# Patient Record
Sex: Male | Born: 1955 | Race: White | Hispanic: No | Marital: Married | Smoking: Former smoker
Health system: Southern US, Community
[De-identification: ages and names within clinical notes are randomized; demographics above are authoritative.]

## PROBLEM LIST (undated history)

## (undated) DIAGNOSIS — Z96659 Presence of unspecified artificial knee joint: Secondary | ICD-10-CM

## (undated) DIAGNOSIS — M1990 Unspecified osteoarthritis, unspecified site: Secondary | ICD-10-CM

## (undated) DIAGNOSIS — M545 Low back pain: Secondary | ICD-10-CM

## (undated) DIAGNOSIS — R55 Syncope and collapse: Secondary | ICD-10-CM

## (undated) DIAGNOSIS — E785 Hyperlipidemia, unspecified: Secondary | ICD-10-CM

## (undated) DIAGNOSIS — K649 Unspecified hemorrhoids: Secondary | ICD-10-CM

## (undated) HISTORY — PX: OTHER SURGICAL HISTORY: SHX169

## (undated) HISTORY — PX: CARPAL TUNNEL RELEASE: SHX101

## (undated) HISTORY — PX: REFRACTIVE SURGERY: SHX103

---

## 1997-02-22 HISTORY — PX: KNEE SURGERY: SHX244

## 2008-07-26 ENCOUNTER — Encounter: Admission: RE | Admit: 2008-07-26 | Discharge: 2008-07-26 | Payer: Self-pay | Admitting: Orthopedic Surgery

## 2009-09-13 HISTORY — PX: COLONOSCOPY: SHX174

## 2013-02-22 DIAGNOSIS — M545 Low back pain, unspecified: Secondary | ICD-10-CM

## 2013-02-22 HISTORY — DX: Low back pain, unspecified: M54.50

## 2016-11-11 DIAGNOSIS — K649 Unspecified hemorrhoids: Secondary | ICD-10-CM

## 2016-11-11 HISTORY — DX: Unspecified hemorrhoids: K64.9

## 2016-12-10 ENCOUNTER — Emergency Department (INDEPENDENT_AMBULATORY_CARE_PROVIDER_SITE_OTHER)
Admission: EM | Admit: 2016-12-10 | Discharge: 2016-12-10 | Disposition: A | Discharge status: Home / Self Care | Payer: 59 | Source: Home / Self Care | Attending: Family Medicine | Admitting: Family Medicine

## 2016-12-10 ENCOUNTER — Encounter: Payer: Self-pay | Admitting: *Deleted

## 2016-12-10 DIAGNOSIS — K645 Perianal venous thrombosis: Secondary | ICD-10-CM

## 2016-12-10 MED ORDER — HYDROCORTISONE ACETATE 25 MG RE SUPP: 25.0000 mg | Freq: Two times a day (BID) | RECTAL | 0 refills | Status: DC

## 2016-12-10 NOTE — Discharge Instructions (Signed)
Keep gauze bandage in place until bleeding stops.  When bleeding stops, may take warm sitz baths for 20 minutes, 3-4 times a day to ease pain and discomfort until healed.  Do not begin using suppositories until healed.  Eat foods that have a lot of fiber in them, such as whole grains, beans, nuts, fruits, and vegetables. May take a stool softener such as Colace, and a fiber product such as Citrucel to avoid constipation.  If symptoms become significantly worse during the night or over the weekend, proceed to the local emergency room.

## 2016-12-10 NOTE — ED Provider Notes (Signed)
[facility]    CSN: [id number] Arrival date & time: 12/10/16  0808     History   Chief Complaint Chief Complaint  Patient presents with  . Cyst    HPI Franklin Lucas is a 61 y.o. male.   Patient complains of a painful lump near his anus for 3 days.  He has applied preparation H cream and suppository without relief.   The history is provided by the patient.    History reviewed. No pertinent past medical history.  There are no active problems to display for this patient.   Past Surgical History:  Procedure Laterality Date  . CARPAL TUNNEL RELEASE Left   . KNEE SURGERY Right   . LT arm surgery- due to lacerations         Home Medications    Prior to Admission medications   Medication Sig Start Date End Date Taking? Authorizing Provider  hydrocortisone (ANUSOL-HC) 25 MG suppository Place 1 suppository (25 mg total) rectally 2 (two) times daily. 12/10/16   Lattie Haw, MD    Family History Family History  Problem Relation Age of Onset  . Cancer Mother     colon    Social History Social History  Substance Use Topics  . Smoking status: Former Games developer  . Smokeless tobacco: Never Used  . Alcohol use No     Allergies   Patient has no known allergies.   Review of Systems Review of Systems  Constitutional: Negative.   HENT: Negative.   Eyes: Negative.   Respiratory: Negative.   Cardiovascular: Negative.   Gastrointestinal: Positive for rectal pain. Negative for abdominal pain, anal bleeding, blood in stool and constipation.  Genitourinary: Negative.   Musculoskeletal: Negative.   Skin: Negative.      Physical Exam Triage Vital Signs ED Triage Vitals [12/10/16 0828]  Enc Vitals Group     BP 121/77     Pulse Rate 69     Resp 18     Temp 97.8 F (36.6 C)     Temp Source Oral     SpO2 97 %     Weight 279 lb (126.6 kg)     Height  (1.956 m)     Head Circumference      Peak Flow      Pain Score 2     Pain Loc      Pain  Edu?      Excl. in GC?    No data found.   Updated Vital Signs BP 121/77 (BP Location: Left Arm)   Pulse 69   Temp 97.8 F (36.6 C) (Oral)   Resp 18   Ht  (1.956 m)   Wt 279 lb (126.6 kg)   SpO2 97%   BMI 33.08 kg/m   Visual Acuity Right Eye Distance:   Left Eye Distance:   Bilateral Distance:    Right Eye Near:   Left Eye Near:    Bilateral Near:     Physical Exam  Constitutional: He appears well-developed and well-nourished. No distress.  HENT:  Head: Normocephalic.  Eyes: Pupils are equal, round, and reactive to light.  Cardiovascular: Normal heart sounds.   Pulmonary/Chest: Breath sounds normal.  Abdominal: There is no tenderness.  Genitourinary:     Genitourinary Comments: 1.5cm diameter thrombosed hemorrhoid as noted on diagram.   Neurological: He is alert.  Skin: Skin is warm and dry.  Nursing note and vitals reviewed.    UC Treatments / Results  Labs (  all labs ordered are listed, but only abnormal results are displayed) Labs Reviewed - No data to display  EKG  EKG Interpretation None       Radiology No results found.  Procedures Procedures Procedure:  I and D thrombosed hemorrhoid After explaining risks/benefits of procedure and obtaining consent from patient, cleaned anus and surrounding area with Betadine.  Using sterile technique and local anesthesia with 1% lidocaine with epinephine, made a single radial incision in the thrombosed hemorrhoid, extracting several fragments of clotted blood.  Applied sterile absorbent dressing.  Wound precautions given.     Medications Ordered in UC Medications - No data to display   Initial Impression / Assessment and Plan / UC Course  I have reviewed the triage vital signs and the nursing notes.  Pertinent labs & imaging results that were available during my care of the patient were reviewed by me and considered in my medical decision making (see chart for details).    Given Rx for Anusol HC  suppositories. Keep gauze bandage in place until bleeding stops.  When bleeding stops, may take warm sitz baths for 20 minutes, 3-4 times a day to ease pain and discomfort until healed.  Do not begin using suppositories until healed.  Eat foods that have a lot of fiber in them, such as whole grains, beans, nuts, fruits, and vegetables. May take a stool softener such as Colace, and a fiber product such as Citrucel to avoid constipation.  If symptoms become significantly worse during the night or over the weekend, proceed to the local emergency room.    Final Clinical Impressions(s) / UC Diagnoses   Final diagnoses:  Hemorrhoid thrombosis    New Prescriptions New Prescriptions   HYDROCORTISONE (ANUSOL-HC) 25 MG SUPPOSITORY    Place 1 suppository (25 mg total) rectally 2 (two) times daily.     Lattie Haw, MD 12/20/16 1420

## 2016-12-10 NOTE — ED Triage Notes (Signed)
Pt c/o lump near his rectum x 3 days. He has applied preparation H cream and suppository without relief.

## 2018-06-13 ENCOUNTER — Encounter: Payer: Self-pay | Admitting: Student

## 2018-06-13 NOTE — H&P (Signed)
TOTAL KNEE ADMISSION H&P  Patient is being admitted for bilateral total knee arthroplasty.  Subjective:  Chief Complaint:bilateral knee pain.  HPI: Franklin Lucas, 62 y.o. male, has a history of pain and functional disability in the bilateral knee due to arthritis and has failed non-surgical conservative treatments for greater than 12 weeks to includecorticosteriod injections, viscosupplementation injections and activity modification.  Onset of symptoms was gradual, starting >10 years ago with gradually worsening course since that time. The patient noted prior procedures on the knee to include  arthroscopy on the right knee(s).  Patient currently rates pain in the bilateral knee(s) at 5 out of 10 with activity. Patient has worsening of pain with activity and weight bearing, crepitus, joint swelling and instability.  Patient has evidence of bone-on-bone arthritis in the medial and patellofemoral compartments of the left knee and significant tri-compartmental narrowing in the right knee by imaging studies. There is no active infection.  There are no active problems to display for this patient.  No past medical history on file.  Past Surgical History:  Procedure Laterality Date  . CARPAL TUNNEL RELEASE Left   . KNEE SURGERY Right   . LT arm surgery- due to lacerations      No current facility-administered medications for this encounter.    Current Outpatient Medications  Medication Sig Dispense Refill Last Dose  . hydrocortisone (ANUSOL-HC) 25 MG suppository Place 1 suppository (25 mg total) rectally 2 (two) times daily. 12 suppository 0    No Known Allergies  Social History   Tobacco Use  . Smoking status: Former Games developer  . Smokeless tobacco: Never Used  Substance Use Topics  . Alcohol use: No    Family History  Problem Relation Age of Onset  . Cancer Mother        colon     Review of Systems  Constitutional: Negative for chills and fever.  HENT: Negative for congestion, sore  throat and tinnitus.   Eyes: Negative for double vision, photophobia and pain.  Respiratory: Negative for cough, shortness of breath and wheezing.   Cardiovascular: Negative for chest pain, palpitations and orthopnea.  Gastrointestinal: Negative for heartburn, nausea and vomiting.  Genitourinary: Negative for dysuria, frequency and urgency.  Musculoskeletal: Positive for joint pain.  Neurological: Negative for dizziness, weakness and headaches.    Objective:  Physical Exam  Well nourished and well developed. General: Alert and oriented x3, cooperative and pleasant, no acute distress. Head: normocephalic, atraumatic, neck supple. Eyes: EOMI. Respiratory: breath sounds clear in all fields, no wheezing, rales, or rhonchi. Cardiovascular: Regular rate and rhythm, no murmurs, gallops or rubs.  Abdomen: non-tender to palpation and soft, normoactive bowel sounds. Musculoskeletal: Antalgic gait without using assisted devices.  Right Hip Exam: ROM: Normal without discomfort. There is no tenderness over the greater trochanter bursa. There is no pain on provocative testing of the hip. Left Hip Exam: ROM: Normal without discomfort. There is no tenderness over the greater trochanter bursa. There is no pain on provocative testing of the hip. Right Knee Exam: No effusion. Range of motion is 0-125 degrees. Moderate crepitus on range of motion of the knee. Positive medial joint line tenderness. No lateral joint line tenderness. Stable knee. Left Knee Exam: No effusion. Range of motion is 5-125 degrees. Moderate crepitus on range of motion of the knee. Positive medial joint line tenderness. No lateral joint line tenderness. Small amount of varus-valgus play. Calves soft and nontender. Motor function intact in LE. Strength 5/5 LE bilaterally. Neuro: Distal pulses 2+.  Sensation to light touch intact in LE.  Vital signs in last 24 hours: Blood pressure: 124/88 mmHg Pulse: 60 bpm   Labs:   Estimated  body mass index is 33.08 kg/m as calculated from the following:   Height as of 12/10/16: 6\' 5"  (1.956 m).   Weight as of 12/10/16: 126.6 kg.   Imaging Review Plain radiographs demonstrate severe degenerative joint disease of the bilateral knee(s). The overall alignment isneutral. The bone quality appears to be adequate for age and reported activity level.   Preoperative templating of the joint replacement has been completed, documented, and submitted to the Operating Room personnel in order to optimize intra-operative equipment management.   Anticipated LOS equal to or greater than 2 midnights due to - Age 61 and older with one or more of the following:  - Obesity  - Expected need for hospital services (PT, OT, Nursing) required for safe  discharge  - Anticipated need for postoperative skilled nursing care or inpatient rehab  - Active co-morbidities: None OR   - Unanticipated findings during/Post Surgery: None  - Patient is a high risk of re-admission due to: None     Assessment/Plan:  End stage arthritis, bilateral knee   The patient history, physical examination, clinical judgment of the provider and imaging studies are consistent with end stage degenerative joint disease of the bilateral knee(s) and total knee arthroplasty is deemed medically necessary. The treatment options including medical management, injection therapy arthroscopy and arthroplasty were discussed at length. The risks and benefits of total knee arthroplasty were presented and reviewed. The risks due to aseptic loosening, infection, stiffness, patella tracking problems, thromboembolic complications and other imponderables were discussed. The patient acknowledged the explanation, agreed to proceed with the plan and consent was signed. Patient is being admitted for inpatient treatment for surgery, pain control, PT, OT, prophylactic antibiotics, VTE prophylaxis, progressive ambulation and ADL's and discharge planning.  The patient is planning to be discharged to inpatient rehab.  Therapy Plans: [facility] Disposition: inpatient rehab Planned DVT Prophylaxis: Xarelto 10 mg daily DME needed: Dan Humphreys, 3-n-1 PCP: Dr. Alver Fisher TXA: IV Allergies: NKDA Anesthesia Concerns: None  - Patient was instructed on what medications to stop prior to surgery. - Follow-up visit in 2 weeks with Dr. Lequita Halt - Begin physical therapy following surgery - Pre-operative lab work as pre-surgical testing - Prescriptions will be provided in hospital at time of discharge  Arther Abbott, PA-C Orthopedic Surgery [facility] [place]

## 2018-06-16 ENCOUNTER — Encounter (HOSPITAL_COMMUNITY): Payer: Self-pay

## 2018-06-16 ENCOUNTER — Other Ambulatory Visit: Payer: Self-pay

## 2018-06-16 ENCOUNTER — Emergency Department (HOSPITAL_COMMUNITY)
Admission: EM | Admit: 2018-06-16 | Discharge: 2018-06-16 | Disposition: A | Discharge status: Home / Self Care | Payer: 59 | Attending: Emergency Medicine | Admitting: Emergency Medicine

## 2018-06-16 ENCOUNTER — Emergency Department (HOSPITAL_COMMUNITY): Payer: 59

## 2018-06-16 DIAGNOSIS — Z87891 Personal history of nicotine dependence: Secondary | ICD-10-CM | POA: Diagnosis not present

## 2018-06-16 DIAGNOSIS — R55 Syncope and collapse: Secondary | ICD-10-CM

## 2018-06-16 HISTORY — DX: Syncope and collapse: R55

## 2018-06-16 LAB — CBC WITH DIFFERENTIAL/PLATELET
Basophils Absolute: 0 10*3/uL (ref 0.0–0.1)
Basophils Relative: 0 %
EOS PCT: 1 %
Eosinophils Absolute: 0.1 10*3/uL (ref 0.0–0.7)
HCT: 38.2 % — ABNORMAL LOW (ref 39.0–52.0)
Hemoglobin: 12.7 g/dL — ABNORMAL LOW (ref 13.0–17.0)
LYMPHS ABS: 1.6 10*3/uL (ref 0.7–4.0)
LYMPHS PCT: 16 %
MCH: 30.9 pg (ref 26.0–34.0)
MCHC: 33.2 g/dL (ref 30.0–36.0)
MCV: 92.9 fL (ref 78.0–100.0)
MONO ABS: 0.9 10*3/uL (ref 0.1–1.0)
MONOS PCT: 9 %
Neutro Abs: 7.6 10*3/uL (ref 1.7–7.7)
Neutrophils Relative %: 74 %
PLATELETS: 195 10*3/uL (ref 150–400)
RBC: 4.11 MIL/uL — ABNORMAL LOW (ref 4.22–5.81)
RDW: 12.3 % (ref 11.5–15.5)
WBC: 10.2 10*3/uL (ref 4.0–10.5)

## 2018-06-16 LAB — MAGNESIUM: Magnesium: 2 mg/dL (ref 1.7–2.4)

## 2018-06-16 LAB — PROTIME-INR
INR: 1.02
PROTHROMBIN TIME: 13.3 s (ref 11.4–15.2)

## 2018-06-16 LAB — COMPREHENSIVE METABOLIC PANEL
ALBUMIN: 3.8 g/dL (ref 3.5–5.0)
ALK PHOS: 55 U/L (ref 38–126)
ALT: 20 U/L (ref 0–44)
ANION GAP: 5 (ref 5–15)
AST: 24 U/L (ref 15–41)
BILIRUBIN TOTAL: 0.6 mg/dL (ref 0.3–1.2)
BUN: 15 mg/dL (ref 6–20)
CALCIUM: 8.5 mg/dL — AB (ref 8.9–10.3)
CO2: 24 mmol/L (ref 22–32)
Chloride: 108 mmol/L (ref 98–111)
Creatinine, Ser: 0.98 mg/dL (ref 0.61–1.24)
GFR calc Af Amer: >60 mL/min (ref >60)
GFR calc non Af Amer: >60 mL/min (ref >60)
GLUCOSE: 112 mg/dL — AB (ref 70–99)
POTASSIUM: 4.1 mmol/L (ref 3.5–5.1)
Sodium: 137 mmol/L (ref 135–145)
TOTAL PROTEIN: 6.5 g/dL (ref 6.5–8.1)

## 2018-06-16 LAB — D-DIMER, QUANTITATIVE (NOT AT ARMC): D DIMER QUANT: 0.5 ug{FEU}/mL (ref 0.00–0.50)

## 2018-06-16 MED ORDER — LACTATED RINGERS IV BOLUS
2000.0000 mL | Freq: Once | INTRAVENOUS | Status: AC
  Administered 2018-06-16: 2000 mL via INTRAVENOUS

## 2018-06-16 NOTE — ED Provider Notes (Signed)
Emergency Department Provider Note   I have reviewed the triage vital signs and the nursing notes.   HISTORY  Chief Complaint Loss of Consciousness   HPI Franklin Lucas is a 62 y.o. male who presents the [facility] today for syncope.  Patient states that he has been overall well health without any known medical problems until today.  He had a shingles shot shortly after they started feeling really sleepy.  Could not explain it but just that he felt sleepy.  Did not get much better and was driving up here from [place] for a soccer game and was sitting in a chair under a tent when he was holding his wife's hand and his hand also got sweaty and she looked over and the patient lost consciousness.  He was unresponsive for approximately 2 minutes.  Apparently someone there has stethoscopes that he had a low pulse.  EMS was called and their arrival patient was slightly hypotensive with blood pressures in the 90s over 60s.  After he woke up he knew who he was and where he was and what was going on.  Did not hit his head or have any trauma during the syncopal episode.  Denies any associated vision changes, weakness, numbness, chest pain, headache or other associated symptoms.  This happened one time before but only when he was watching the birth of his daughter.  No known family history of early death or cardiac arrhythmias.  Patient has no medical history. No other associated or modifying symptoms.    History reviewed. No pertinent past medical history.  There are no active problems to display for this patient.   Past Surgical History:  Procedure Laterality Date  . CARPAL TUNNEL RELEASE Left   . KNEE SURGERY Right   . LT arm surgery- due to lacerations      Current Outpatient Rx  . Order #: [id number] Class: Normal    Allergies Patient has no known allergies.  Family History  Problem Relation Age of Onset  . Cancer Mother        colon    Social History Social History    Tobacco Use  . Smoking status: Former Games developer  . Smokeless tobacco: Never Used  Substance Use Topics  . Alcohol use: No  . Drug use: No    Review of Systems  All other systems negative except as documented in the HPI. All pertinent positives and negatives as reviewed in the HPI. ____________________________________________   PHYSICAL EXAM:  VITAL SIGNS: ED Triage Vitals [06/16/18 1710]  Enc Vitals Group     BP      Pulse      Resp      Temp      Temp src      SpO2      Weight 275 lb (124.7 kg)     Height 6\' 5"  (1.956 m)     Head Circumference      Peak Flow      Pain Score      Pain Loc      Pain Edu?      Excl. in GC?     Constitutional: Alert and oriented. Well appearing and in no acute distress. Eyes: Conjunctivae are normal. PERRL. EOMI. Head: Atraumatic. Nose: No congestion/rhinnorhea. Mouth/Throat: Mucous membranes are moist.  Oropharynx non-erythematous. Neck: No stridor.  No meningeal signs.   Cardiovascular: Normal rate, regular rhythm. Good peripheral circulation. Grossly normal heart sounds.   Respiratory: Normal respiratory effort.  No retractions. Lungs  CTAB. Oxygen saturations around 94-97% when talking.  Gastrointestinal: Soft and nontender. No distention.  Musculoskeletal: No lower extremity tenderness nor edema. No gross deformities of extremities. Neurologic:  Normal speech and language. No gross focal neurologic deficits are appreciated.  Skin:  Skin is warm, dry and intact. No rash noted.   ____________________________________________   LABS (all labs ordered are listed, but only abnormal results are displayed)  Labs Reviewed  CBC WITH DIFFERENTIAL/PLATELET - Abnormal; Notable for the following components:      Result Value   RBC 4.11 (*)    Hemoglobin 12.7 (*)    HCT 38.2 (*)    All other components within normal limits  COMPREHENSIVE METABOLIC PANEL - Abnormal; Notable for the following components:   Glucose, Bld 112 (*)     Calcium 8.5 (*)    All other components within normal limits  MAGNESIUM  D-DIMER, QUANTITATIVE (NOT AT [facility])  PROTIME-INR  CBG MONITORING, ED   ____________________________________________  EKG   EKG Interpretation  Date/Time:  Friday June 16 2018 17:19:58 EDT Ventricular Rate:  68 PR Interval:    QRS Duration: 106 QT Interval:  420 QTC Calculation: 447 R Axis:   52 Text Interpretation:  Sinus rhythm Abnormal inferior Q waves No old tracing to compare Confirmed by Marily Memos [phone]) on 06/16/2018 7:51:30 PM       ____________________________________________  RADIOLOGY  Dg Chest 2 View  Result Date: 06/16/2018 CLINICAL DATA:  Syncope today. EXAM: CHEST - 2 VIEW COMPARISON:  None. FINDINGS: The lungs are clear. Heart size is normal. No pneumothorax or pleural fluid. No acute or focal bony abnormality. IMPRESSION: No acute disease. Electronically Signed   By: Drusilla Kanner M.D.   On: 06/16/2018 18:46    ____________________________________________   INITIAL IMPRESSION / ASSESSMENT AND PLAN / ED COURSE  Very odd story for syncope.  No vasovagal type symptoms.  Could be cardiac arrhythmia.  Also could be PE as his oxygen saturations are low but low and he has had any recent illnesses nor does have a history of smoking.  Could be cardiac related.  Low suspicion for neurologic causes such as stroke as he has no neuro deficits at this time and no history of the same.  Unremarkable.  D-dimer appropriate for his age.  Blood pressure 135 systolic after couple liters of fluid patient asymptomatic.  We will continue following up with his primary doctor.  Low suspicion for any emergent causes for his syncope at this time however still a bit atypical.  Pertinent labs & imaging results that were available during my care of the patient were reviewed by me and considered in my medical decision making (see chart for details).  ____________________________________________  FINAL  CLINICAL IMPRESSION(S) / ED DIAGNOSES  Final diagnoses:  Syncope, unspecified syncope type    MEDICATIONS GIVEN DURING THIS VISIT:  Medications  lactated ringers bolus 2,000 mL (0 mLs Intravenous Stopped 06/16/18 1850)    NEW OUTPATIENT MEDICATIONS STARTED DURING THIS VISIT:  Discharge Medication List as of 06/16/2018  7:52 PM      Note:  This note was prepared with assistance of Dragon voice recognition software. Occasional wrong-word or sound-a-like substitutions may have occurred due to the inherent limitations of voice recognition software.   Marily Memos, MD 06/16/18 2053

## 2018-06-16 NOTE — ED Notes (Signed)
Pt awake, alert and oriented x 3 at time of discharge. Pt ambulatory with observed steady gait. Pt 's family member upset of delay in discharge. Explained to family member was waiting on discharge paper work. Dr Erin Hearing in to bedside to explain findings and plan of care. Pt discharged to home with family. No new complaints or concerns verbalized.

## 2018-06-16 NOTE — ED Triage Notes (Signed)
Pt was watching his grandson play soccer this afternoon and pt had a syncopal episode.  Pt says he had a shingles vaccine this morning.  Pt says prior to the soccer game he noticed he felt tired.  EMS says initial bp was 96/60.  EMS started IV and gave bolus nss.  Pt 100/59 after bolus.  Pt alert and oriented.  Denies any pain.  Denies fall.

## 2018-07-04 ENCOUNTER — Encounter (HOSPITAL_COMMUNITY): Payer: Self-pay

## 2018-07-04 NOTE — Pre-Procedure Instructions (Signed)
Th following are on the chart: Medical clearance Dr. Alver Fisher 05/10/2018 Last office visit note 02/17/2018  The following are in epic: EKG 06/16/2018 CBC/diff, CMP. PT 06/16/2018

## 2018-07-04 NOTE — Patient Instructions (Signed)
Your procedure is scheduled on: Wednesday, Oct. 30, 2019   Surgery Time:  10:30AM-1:30PM   Report to [facility] Main  Entrance    Report to admitting at 8:00 AM   Call this number if you have problems the morning of surgery [phone]   Do not eat food or drink liquids :After Midnight.   Brush your teeth the morning of surgery.   Do NOT smoke after Midnight   Take these medicines the morning of surgery with A SIP OF WATER: None   May use eye drops per normal routine                               You may not have any metal on your body including jewelry, and body piercings             Do not wear lotions, powders, perfumes/cologne, or deodorant                          Men may shave face and neck.   Do not bring valuables to the hospital. [facility] IS NOT             RESPONSIBLE   FOR VALUABLES.   Contacts, dentures or bridgework may not be worn into surgery.   Leave suitcase in the car. After surgery it may be brought to your room.   Special Instructions: Bring a copy of your healthcare power of attorney and living will documents         the day of surgery if you haven't scanned them in before.              Please read over the following fact sheets you were given:  [facility] - Preparing for Surgery Before surgery, you can play an important role.  Because skin is not sterile, your skin needs to be as free of germs as possible.  You can reduce the number of germs on your skin by washing with CHG (chlorahexidine gluconate) soap before surgery.  CHG is an antiseptic cleaner which kills germs and bonds with the skin to continue killing germs even after washing. Please DO NOT use if you have an allergy to CHG or antibacterial soaps.  If your skin becomes reddened/irritated stop using the CHG and inform your nurse when you arrive at Short Stay. Do not shave (including legs and underarms) for at least 48 hours prior to the first CHG shower.  You may shave your  face/neck.  Please follow these instructions carefully:  1.  Shower with CHG Soap the night before surgery and the  morning of surgery.  2.  If you choose to wash your hair, wash your hair first as usual with your normal  shampoo.  3.  After you shampoo, rinse your hair and body thoroughly to remove the shampoo.                             4.  Use CHG as you would any other liquid soap.  You can apply chg directly to the skin and wash.  Gently with a scrungie or clean washcloth.  5.  Apply the CHG Soap to your body ONLY FROM THE NECK DOWN.   Do   not use on face/ open  Wound or open sores. Avoid contact with eyes, ears mouth and   genitals (private parts).                       Wash face,  Genitals (private parts) with your normal soap.             6.  Wash thoroughly, paying special attention to the area where your    surgery  will be performed.  7.  Thoroughly rinse your body with warm water from the neck down.  8.  DO NOT shower/wash with your normal soap after using and rinsing off the CHG Soap.                9.  Pat yourself dry with a clean towel.            10.  Wear clean pajamas.            11.  Place clean sheets on your bed the night of your first shower and do not  sleep with pets. Day of Surgery : Do not apply any lotions/deodorants the morning of surgery.  Please wear clean clothes to the hospital/surgery center.  FAILURE TO FOLLOW THESE INSTRUCTIONS MAY RESULT IN THE CANCELLATION OF YOUR SURGERY  PATIENT SIGNATURE_________________________________  NURSE SIGNATURE__________________________________  ________________________________________________________________________   Franklin Lucas  An incentive spirometer is a tool that can help keep your lungs clear and active. This tool measures how well you are filling your lungs with each breath. Taking long deep breaths may help reverse or decrease the chance of developing breathing (pulmonary)  problems (especially infection) following:  A long period of time when you are unable to move or be active. BEFORE THE PROCEDURE   If the spirometer includes an indicator to show your best effort, your nurse or respiratory therapist will set it to a desired goal.  If possible, sit up straight or lean slightly forward. Try not to slouch.  Hold the incentive spirometer in an upright position. INSTRUCTIONS FOR USE  1. Sit on the edge of your bed if possible, or sit up as far as you can in bed or on a chair. 2. Hold the incentive spirometer in an upright position. 3. Breathe out normally. 4. Place the mouthpiece in your mouth and seal your lips tightly around it. 5. Breathe in slowly and as deeply as possible, raising the piston or the ball toward the top of the column. 6. Hold your breath for 3-5 seconds or for as long as possible. Allow the piston or ball to fall to the bottom of the column. 7. Remove the mouthpiece from your mouth and breathe out normally. 8. Rest for a few seconds and repeat Steps 1 through 7 at least 10 times every 1-2 hours when you are awake. Take your time and take a few normal breaths between deep breaths. 9. The spirometer may include an indicator to show your best effort. Use the indicator as a goal to work toward during each repetition. 10. After each set of 10 deep breaths, practice coughing to be sure your lungs are clear. If you have an incision (the cut made at the time of surgery), support your incision when coughing by placing a pillow or rolled up towels firmly against it. Once you are able to get out of bed, walk around indoors and cough well. You may stop using the incentive spirometer when instructed by your caregiver.  RISKS AND COMPLICATIONS  Take your time  so you do not get dizzy or light-headed.  If you are in pain, you may need to take or ask for pain medication before doing incentive spirometry. It is harder to take a deep breath if you are having  pain. AFTER USE  Rest and breathe slowly and easily.  It can be helpful to keep track of a log of your progress. Your caregiver can provide you with a simple table to help with this. If you are using the spirometer at home, follow these instructions: [place] IF:   You are having difficultly using the spirometer.  You have trouble using the spirometer as often as instructed.  Your pain medication is not giving enough relief while using the spirometer.  You develop fever of 100.5 F (38.1 C) or higher. SEEK IMMEDIATE MEDICAL CARE IF:   You cough up bloody sputum that had not been present before.  You develop fever of 102 F (38.9 C) or greater.  You develop worsening pain at or near the incision site. MAKE SURE YOU:   Understand these instructions.  Will watch your condition.  Will get help right away if you are not doing well or get worse. Document Released: 01/10/2007 Document Revised: 11/22/2011 Document Reviewed: 03/13/2007 Exit[employer] Patient Information 2014 Exit[employer], [place].   ________________________________________________________________________  WHAT IS A BLOOD TRANSFUSION? Blood Transfusion Information  A transfusion is the replacement of blood or some of its parts. Blood is made up of multiple cells which provide different functions.  Red blood cells carry oxygen and are used for blood loss replacement.  White blood cells fight against infection.  Platelets control bleeding.  Plasma helps clot blood.  Other blood products are available for specialized needs, such as hemophilia or other clotting disorders. BEFORE THE TRANSFUSION  Who gives blood for transfusions?   Healthy volunteers who are fully evaluated to make sure their blood is safe. This is blood bank blood. Transfusion therapy is the safest it has ever been in the practice of medicine. Before blood is taken from a donor, a complete history is taken to make sure that person has no history  of diseases nor engages in risky social behavior (examples are intravenous drug use or sexual activity with multiple partners). The donor's travel history is screened to minimize risk of transmitting infections, such as malaria. The donated blood is tested for signs of infectious diseases, such as HIV and hepatitis. The blood is then tested to be sure it is compatible with you in order to minimize the chance of a transfusion reaction. If you or a relative donates blood, this is often done in anticipation of surgery and is not appropriate for emergency situations. It takes many days to process the donated blood. RISKS AND COMPLICATIONS Although transfusion therapy is very safe and saves many lives, the main dangers of transfusion include:   Getting an infectious disease.  Developing a transfusion reaction. This is an allergic reaction to something in the blood you were given. Every precaution is taken to prevent this. The decision to have a blood transfusion has been considered carefully by your caregiver before blood is given. Blood is not given unless the benefits outweigh the risks. AFTER THE TRANSFUSION  Right after receiving a blood transfusion, you will usually feel much better and more energetic. This is especially true if your red blood cells have gotten low (anemic). The transfusion raises the level of the red blood cells which carry oxygen, and this usually causes an energy increase.  The  nurse administering the transfusion will monitor you carefully for complications. HOME CARE INSTRUCTIONS  No special instructions are needed after a transfusion. You may find your energy is better. Speak with your caregiver about any limitations on activity for underlying diseases you may have. SEEK MEDICAL CARE IF:   Your condition is not improving after your transfusion.  You develop redness or irritation at the intravenous (IV) site. SEEK IMMEDIATE MEDICAL CARE IF:  Any of the following symptoms  occur over the next 12 hours:  Shaking chills.  You have a temperature by mouth above 102 F (38.9 C), not controlled by medicine.  Chest, back, or muscle pain.  People around you feel you are not acting correctly or are confused.  Shortness of breath or difficulty breathing.  Dizziness and fainting.  You get a rash or develop hives.  You have a decrease in urine output.  Your urine turns a dark color or changes to pink, red, or brown. Any of the following symptoms occur over the next 10 days:  You have a temperature by mouth above 102 F (38.9 C), not controlled by medicine.  Shortness of breath.  Weakness after normal activity.  The white part of the eye turns yellow (jaundice).  You have a decrease in the amount of urine or are urinating less often.  Your urine turns a dark color or changes to pink, red, or brown. Document Released: 08/27/2000 Document Revised: 11/22/2011 Document Reviewed: 04/15/2008 [facility] Patient Information 2014 [place], [place].  _______________________________________________________________________

## 2018-07-06 ENCOUNTER — Other Ambulatory Visit: Payer: Self-pay

## 2018-07-06 ENCOUNTER — Encounter (HOSPITAL_COMMUNITY)
Admission: RE | Admit: 2018-07-06 | Discharge: 2018-07-06 | Disposition: A | Discharge status: Home / Self Care | Payer: 59 | Source: Ambulatory Visit | Attending: Orthopedic Surgery | Admitting: Orthopedic Surgery

## 2018-07-06 ENCOUNTER — Encounter (HOSPITAL_COMMUNITY): Payer: Self-pay

## 2018-07-06 DIAGNOSIS — Z01812 Encounter for preprocedural laboratory examination: Secondary | ICD-10-CM | POA: Insufficient documentation

## 2018-07-06 DIAGNOSIS — M170 Bilateral primary osteoarthritis of knee: Secondary | ICD-10-CM | POA: Diagnosis not present

## 2018-07-06 HISTORY — DX: Hyperlipidemia, unspecified: E78.5

## 2018-07-06 HISTORY — DX: Unspecified osteoarthritis, unspecified site: M19.90

## 2018-07-06 HISTORY — DX: Unspecified hemorrhoids: K64.9

## 2018-07-06 HISTORY — DX: Syncope and collapse: R55

## 2018-07-06 HISTORY — DX: Low back pain: M54.5

## 2018-07-06 LAB — ABO/RH: ABO/RH(D): O POS

## 2018-07-06 LAB — SURGICAL PCR SCREEN
MRSA, PCR: NEGATIVE
Staphylococcus aureus: NEGATIVE

## 2018-07-06 LAB — APTT: APTT: 28 s (ref 24–36)

## 2018-07-07 ENCOUNTER — Encounter: Payer: Self-pay | Admitting: Internal Medicine

## 2018-07-07 ENCOUNTER — Ambulatory Visit (INDEPENDENT_AMBULATORY_CARE_PROVIDER_SITE_OTHER): Payer: 59 | Admitting: Internal Medicine

## 2018-07-07 VITALS — BP 128/74 | HR 74 | Ht 77.0 in | Wt 279.2 lb

## 2018-07-07 DIAGNOSIS — Z01810 Encounter for preprocedural cardiovascular examination: Secondary | ICD-10-CM

## 2018-07-07 NOTE — Patient Instructions (Addendum)
Medication Instructions:  Your physician recommends that you continue on your current medications as directed. Please refer to the Current Medication list given to you today.  If you need a refill on your cardiac medications before your next appointment, please call your pharmacy.   Lab work: None ordered  Testing/Procedures: Your physician has requested that you have a stress echocardiogram. For further information please visit [email or web address] Please follow instruction sheet as given.  Your test is scheduled on 07/11/18 @2pm  please arrive at 1:45pm [facility] [address] Phone [phone]  Follow-Up: At [facility], you and your health needs are our priority.  As part of our continuing mission to provide you with exceptional heart care, we have created designated Provider Care Teams.  These Care Teams include your primary Cardiologist (physician) and Advanced Practice Providers (APPs -  Physician Assistants and Nurse Practitioners) who all work together to provide you with the care you need, when you need it. You will need a follow up appointment as needed pending results.     Any Other Special Instructions Will Be Listed Below (If Applicable).   Exercise Stress Echocardiogram An exercise stress echocardiogram is a test to check how well your heart is working. This test uses sound waves (ultrasound) and a computer to make images of your heart before and after exercise. Ultrasound images that are taken before you exercise (your resting echocardiogram) will show how much blood is getting to your heart muscle and how well your heart muscle and heart valves are functioning. During the next part of this test, you will walk on a treadmill or ride a stationary bike to see how exercise affects your heart. While you exercise, the electrical activity of your heart will be monitored with an electrocardiogram (ECG). Your blood pressure will also be  monitored. You may have this test if you:  Have chest pain or other symptoms of a heart problem.  Recently had a heart attack or heart surgery.  Have heart valve problems.  Have a condition that causes narrowing of the blood vessels that supply your heart (coronary artery disease).  Have a high risk of heart disease and are starting a new exercise program.  Have a high risk of heart disease and need to have major surgery.  Tell a health care provider about:  Any allergies you have.  All medicines you are taking, including vitamins, herbs, eye drops, creams, and over-the-counter medicines.  Any problems you or family members have had with anesthetic medicines.  Any blood disorders you have.  Any surgeries you have had.  Any medical conditions you have.  Whether you are pregnant or may be pregnant. What are the risks? Generally, this is a safe procedure. However, problems may occur, including:  Chest pain.  Dizziness or light-headedness.  Shortness of breath.  Increased or irregular heartbeat (palpitations).  Nausea or vomiting.  Heart attack (very rare).  What happens before the procedure?  Follow instructions from your health care provider about eating or drinking restrictions. You may be asked to avoid all forms of caffeine for 24 hours before your procedure, or as told by your health care provider.  Ask your health care provider about changing or stopping your regular medicines. This is especially important if you are taking diabetes medicines or blood thinners.  If you use an inhaler, bring it with you to the test.  Wear loose, comfortable clothing and walking shoes.  Do notuse any products that contain nicotine or tobacco,  such as cigarettes and e-cigarettes, for 4 hours before the test or as told by your health care provider. If you need help quitting, ask your health care provider. What happens during the procedure?  You will take off your clothes from  the waist up and put on a hospital gown.  A technician will place electrodes on your chest.  A blood pressure cuff will be placed on your arm.  You will lie down on a table for an ultrasound exam before you exercise. Gel will be rubbed on your chest, and a handheld device (transducer) will be pressed against your chest and moved over your heart.  Then, you will start exercising by walking on a treadmill or pedaling a stationary bicycle.  Your blood pressure and heart rhythm will be monitored while you exercise.  The exercise will gradually get harder or faster.  You will exercise until: ? Your heart reaches a target level. ? You are too tired to continue. ? You cannot continue because of chest pain, weakness, or dizziness.  You will have another ultrasound exam after you stop exercising. The procedure may vary among health care providers and hospitals. What happens after the procedure?  Your heart rate and blood pressure will be monitored until they return to your normal levels. Summary  An exercise stress echocardiogram is a test that uses ultrasound to check how well your heart works before and after exercise.  Before the test, follow instructions from your health care provider about stopping medications, avoiding nicotine and tobacco, and avoiding certain foods and drinks.  During the test, your blood pressure and heart rhythm will be monitored while you exercise on a treadmill or stationary bicycle. This information is not intended to replace advice given to you by your health care provider. Make sure you discuss any questions you have with your health care provider. Document Released: 09/03/2004 Document Revised: 04/21/2016 Document Reviewed: 04/21/2016 Elsevier Interactive Patient Education  2018 [employer].

## 2018-07-10 ENCOUNTER — Telehealth (HOSPITAL_COMMUNITY): Payer: Self-pay | Admitting: *Deleted

## 2018-07-10 NOTE — Telephone Encounter (Signed)
Patient given detailed instructions per Stress Test Requisition Sheet for test on 07/11/18 at 200.Patient Notified to arrive 30 minutes early, and that it is imperative to arrive on time for appointment to keep from having the test rescheduled.  Patient verbalized understanding. Franklin Lucas, [name]

## 2018-07-11 ENCOUNTER — Other Ambulatory Visit: Payer: Self-pay

## 2018-07-11 ENCOUNTER — Ambulatory Visit (HOSPITAL_BASED_OUTPATIENT_CLINIC_OR_DEPARTMENT_OTHER): Payer: 59

## 2018-07-11 ENCOUNTER — Ambulatory Visit (HOSPITAL_COMMUNITY): Payer: 59

## 2018-07-11 DIAGNOSIS — Z01810 Encounter for preprocedural cardiovascular examination: Secondary | ICD-10-CM | POA: Diagnosis not present

## 2018-07-11 MED ORDER — DEXTROSE 5 % IV SOLN
3.0000 g | INTRAVENOUS | Status: AC
  Administered 2018-07-12: 3 g via INTRAVENOUS
  Filled 2018-07-11: qty 3

## 2018-07-11 NOTE — Pre-Procedure Instructions (Signed)
Lauren RN in short stay made aware that we are still awaiting cardiac clearance from stress echo results  performed 07/11/2018.

## 2018-07-11 NOTE — Pre-Procedure Instructions (Signed)
Spoke with Tresa Endo at Dr. Deri Fuelling office she stated the cardiology office will put a note in epic concerning cardiac clearance this evening.

## 2018-07-12 ENCOUNTER — Encounter (HOSPITAL_COMMUNITY): Payer: Self-pay

## 2018-07-12 ENCOUNTER — Telehealth: Payer: Self-pay

## 2018-07-12 ENCOUNTER — Other Ambulatory Visit: Payer: Self-pay

## 2018-07-12 ENCOUNTER — Inpatient Hospital Stay (HOSPITAL_COMMUNITY)
Admission: RE | Admit: 2018-07-12 | Discharge: 2018-07-17 | DRG: 462 | Disposition: A | Discharge status: Rehabilitation Facility | Payer: 59 | Attending: Orthopedic Surgery | Admitting: Orthopedic Surgery

## 2018-07-12 ENCOUNTER — Encounter (HOSPITAL_COMMUNITY)
Admission: RE | Disposition: A | Discharge status: Rehabilitation Facility | Payer: Self-pay | Source: Ambulatory Visit | Attending: Orthopedic Surgery

## 2018-07-12 ENCOUNTER — Inpatient Hospital Stay (HOSPITAL_COMMUNITY): Payer: 59 | Admitting: Anesthesiology

## 2018-07-12 DIAGNOSIS — G8918 Other acute postprocedural pain: Secondary | ICD-10-CM

## 2018-07-12 DIAGNOSIS — D62 Acute posthemorrhagic anemia: Secondary | ICD-10-CM

## 2018-07-12 DIAGNOSIS — M1710 Unilateral primary osteoarthritis, unspecified knee: Secondary | ICD-10-CM | POA: Diagnosis present

## 2018-07-12 DIAGNOSIS — M7989 Other specified soft tissue disorders: Secondary | ICD-10-CM | POA: Diagnosis not present

## 2018-07-12 DIAGNOSIS — Z87891 Personal history of nicotine dependence: Secondary | ICD-10-CM | POA: Diagnosis not present

## 2018-07-12 DIAGNOSIS — E785 Hyperlipidemia, unspecified: Secondary | ICD-10-CM | POA: Diagnosis present

## 2018-07-12 DIAGNOSIS — Z96653 Presence of artificial knee joint, bilateral: Secondary | ICD-10-CM | POA: Diagnosis not present

## 2018-07-12 DIAGNOSIS — Z4789 Encounter for other orthopedic aftercare: Secondary | ICD-10-CM | POA: Diagnosis not present

## 2018-07-12 DIAGNOSIS — Z6833 Body mass index (BMI) 33.0-33.9, adult: Secondary | ICD-10-CM

## 2018-07-12 DIAGNOSIS — E669 Obesity, unspecified: Secondary | ICD-10-CM | POA: Diagnosis present

## 2018-07-12 DIAGNOSIS — R609 Edema, unspecified: Secondary | ICD-10-CM | POA: Diagnosis not present

## 2018-07-12 DIAGNOSIS — M179 Osteoarthritis of knee, unspecified: Secondary | ICD-10-CM | POA: Diagnosis present

## 2018-07-12 DIAGNOSIS — Z9889 Other specified postprocedural states: Secondary | ICD-10-CM | POA: Diagnosis not present

## 2018-07-12 DIAGNOSIS — K5903 Drug induced constipation: Secondary | ICD-10-CM

## 2018-07-12 DIAGNOSIS — M170 Bilateral primary osteoarthritis of knee: Secondary | ICD-10-CM | POA: Diagnosis present

## 2018-07-12 HISTORY — PX: TOTAL KNEE ARTHROPLASTY: SHX125

## 2018-07-12 LAB — TYPE AND SCREEN
ABO/RH(D): O POS
Antibody Screen: NEGATIVE

## 2018-07-12 SURGERY — ARTHROPLASTY, KNEE, BILATERAL, TOTAL
Anesthesia: Spinal | Site: Knee | Laterality: Bilateral

## 2018-07-12 MED ORDER — PHENOL 1.4 % MT LIQD: 1.0000 | OROMUCOSAL | Status: DC | PRN

## 2018-07-12 MED ORDER — PHENYLEPHRINE HCL 10 MG/ML IJ SOLN
INTRAMUSCULAR | Status: AC
  Filled 2018-07-12: qty 1

## 2018-07-12 MED ORDER — SODIUM CHLORIDE 0.9 % IV SOLN
INTRAVENOUS | Status: DC
  Administered 2018-07-12 – 2018-07-13 (×4): via INTRAVENOUS

## 2018-07-12 MED ORDER — ROPIVACAINE HCL 2 MG/ML IJ SOLN
10.0000 mL/h | INTRAMUSCULAR | Status: DC
  Administered 2018-07-12 – 2018-07-13 (×2): 10 mL/h via EPIDURAL
  Filled 2018-07-12 (×3): qty 200

## 2018-07-12 MED ORDER — FENTANYL CITRATE (PF) 100 MCG/2ML IJ SOLN
INTRAMUSCULAR | Status: DC | PRN
  Administered 2018-07-12: 100 ug via INTRAVENOUS

## 2018-07-12 MED ORDER — POLYETHYLENE GLYCOL 3350 17 G PO PACK
17.0000 g | PACK | Freq: Every day | ORAL | Status: DC | PRN
  Administered 2018-07-17: 17 g via ORAL
  Filled 2018-07-12: qty 1

## 2018-07-12 MED ORDER — TRANEXAMIC ACID-NACL 1000-0.7 MG/100ML-% IV SOLN
1000.0000 mg | INTRAVENOUS | Status: AC
  Administered 2018-07-12: 1000 mg via INTRAVENOUS
  Filled 2018-07-12: qty 100

## 2018-07-12 MED ORDER — MIDAZOLAM HCL 2 MG/2ML IJ SOLN
INTRAMUSCULAR | Status: AC
  Filled 2018-07-12: qty 2

## 2018-07-12 MED ORDER — TRAMADOL HCL 50 MG PO TABS
50.0000 mg | ORAL_TABLET | Freq: Four times a day (QID) | ORAL | Status: DC | PRN
  Administered 2018-07-13 – 2018-07-17 (×9): 100 mg via ORAL
  Filled 2018-07-12 (×9): qty 2

## 2018-07-12 MED ORDER — PROPOFOL 10 MG/ML IV BOLUS
INTRAVENOUS | Status: AC
  Filled 2018-07-12: qty 60

## 2018-07-12 MED ORDER — BISACODYL 10 MG RE SUPP: 10.0000 mg | Freq: Every day | RECTAL | Status: DC | PRN

## 2018-07-12 MED ORDER — MENTHOL 3 MG MT LOZG: 1.0000 | LOZENGE | OROMUCOSAL | Status: DC | PRN

## 2018-07-12 MED ORDER — ONDANSETRON HCL 4 MG/2ML IJ SOLN
INTRAMUSCULAR | Status: DC | PRN
  Administered 2018-07-12: 4 mg via INTRAVENOUS

## 2018-07-12 MED ORDER — EPHEDRINE SULFATE-NACL 50-0.9 MG/10ML-% IV SOSY
PREFILLED_SYRINGE | INTRAVENOUS | Status: DC | PRN
  Administered 2018-07-12: 5 mg via INTRAVENOUS

## 2018-07-12 MED ORDER — MIDAZOLAM HCL 5 MG/5ML IJ SOLN
INTRAMUSCULAR | Status: DC | PRN
  Administered 2018-07-12: 2 mg via INTRAVENOUS

## 2018-07-12 MED ORDER — DEXAMETHASONE SODIUM PHOSPHATE 10 MG/ML IJ SOLN
10.0000 mg | Freq: Once | INTRAMUSCULAR | Status: AC
  Administered 2018-07-13: 10 mg via INTRAVENOUS
  Filled 2018-07-12: qty 1

## 2018-07-12 MED ORDER — DEXAMETHASONE SODIUM PHOSPHATE 10 MG/ML IJ SOLN: 8.0000 mg | Freq: Once | INTRAMUSCULAR | Status: DC

## 2018-07-12 MED ORDER — FLEET ENEMA 7-19 GM/118ML RE ENEM: 1.0000 | ENEMA | Freq: Once | RECTAL | Status: DC | PRN

## 2018-07-12 MED ORDER — LACTATED RINGERS IV SOLN
INTRAVENOUS | Status: DC
  Administered 2018-07-12: 1000 mL via INTRAVENOUS
  Administered 2018-07-12 (×2): via INTRAVENOUS

## 2018-07-12 MED ORDER — BUPIVACAINE IN DEXTROSE 0.75-8.25 % IT SOLN
INTRATHECAL | Status: DC | PRN
  Administered 2018-07-12: 2 mL via INTRATHECAL

## 2018-07-12 MED ORDER — METHOCARBAMOL 500 MG PO TABS
500.0000 mg | ORAL_TABLET | Freq: Four times a day (QID) | ORAL | Status: DC | PRN
  Administered 2018-07-13 – 2018-07-17 (×10): 500 mg via ORAL
  Filled 2018-07-12 (×11): qty 1

## 2018-07-12 MED ORDER — OXYCODONE HCL 5 MG PO TABS
5.0000 mg | ORAL_TABLET | ORAL | Status: DC | PRN
  Administered 2018-07-13: 5 mg via ORAL
  Administered 2018-07-13: 10 mg via ORAL
  Administered 2018-07-13: 5 mg via ORAL
  Administered 2018-07-13 – 2018-07-14 (×4): 10 mg via ORAL
  Administered 2018-07-14: 5 mg via ORAL
  Administered 2018-07-16: 10 mg via ORAL
  Filled 2018-07-12: qty 1
  Filled 2018-07-12 (×6): qty 2
  Filled 2018-07-12 (×3): qty 1
  Filled 2018-07-12 (×2): qty 2

## 2018-07-12 MED ORDER — OXYCODONE HCL 5 MG PO TABS
ORAL_TABLET | ORAL | Status: AC
  Administered 2018-07-12: 14:00:00
  Filled 2018-07-12: qty 1

## 2018-07-12 MED ORDER — LIDOCAINE-EPINEPHRINE (PF) 2 %-1:200000 IJ SOLN
INTRAMUSCULAR | Status: DC | PRN
  Administered 2018-07-12: 5 mL via EPIDURAL

## 2018-07-12 MED ORDER — PROPOFOL 10 MG/ML IV BOLUS
INTRAVENOUS | Status: AC
  Filled 2018-07-12: qty 20

## 2018-07-12 MED ORDER — ONDANSETRON HCL 4 MG/2ML IJ SOLN: 4.0000 mg | Freq: Four times a day (QID) | INTRAMUSCULAR | Status: DC | PRN

## 2018-07-12 MED ORDER — ONDANSETRON HCL 4 MG PO TABS
4.0000 mg | ORAL_TABLET | Freq: Four times a day (QID) | ORAL | Status: DC | PRN
  Administered 2018-07-17: 4 mg via ORAL
  Filled 2018-07-12: qty 1

## 2018-07-12 MED ORDER — CHLORHEXIDINE GLUCONATE 4 % EX LIQD: 60.0000 mL | Freq: Once | CUTANEOUS | Status: DC

## 2018-07-12 MED ORDER — ACETAMINOPHEN 500 MG PO TABS
1000.0000 mg | ORAL_TABLET | Freq: Four times a day (QID) | ORAL | Status: AC
  Administered 2018-07-12 – 2018-07-13 (×4): 1000 mg via ORAL
  Filled 2018-07-12 (×4): qty 2

## 2018-07-12 MED ORDER — SODIUM CHLORIDE 0.9 % IV SOLN
INTRAVENOUS | Status: DC | PRN
  Administered 2018-07-12: 20 ug/min via INTRAVENOUS

## 2018-07-12 MED ORDER — PHENYLEPHRINE 40 MCG/ML (10ML) SYRINGE FOR IV PUSH (FOR BLOOD PRESSURE SUPPORT)
PREFILLED_SYRINGE | INTRAVENOUS | Status: DC | PRN
  Administered 2018-07-12 (×2): 80 ug via INTRAVENOUS
  Administered 2018-07-12 (×2): 120 ug via INTRAVENOUS

## 2018-07-12 MED ORDER — FENTANYL CITRATE (PF) 100 MCG/2ML IJ SOLN
INTRAMUSCULAR | Status: AC
  Filled 2018-07-12: qty 2

## 2018-07-12 MED ORDER — SODIUM CHLORIDE 0.9 % IR SOLN
Status: DC | PRN
  Administered 2018-07-12: 3000 mL

## 2018-07-12 MED ORDER — METOCLOPRAMIDE HCL 5 MG PO TABS: 5.0000 mg | ORAL_TABLET | Freq: Three times a day (TID) | ORAL | Status: DC | PRN

## 2018-07-12 MED ORDER — EPHEDRINE 5 MG/ML INJ
INTRAVENOUS | Status: AC
  Filled 2018-07-12: qty 10

## 2018-07-12 MED ORDER — STERILE WATER FOR IRRIGATION IR SOLN
Status: DC | PRN
  Administered 2018-07-12: 2000 mL

## 2018-07-12 MED ORDER — FENTANYL CITRATE (PF) 100 MCG/2ML IJ SOLN: 25.0000 ug | INTRAMUSCULAR | Status: DC | PRN

## 2018-07-12 MED ORDER — ROPIVACAINE HCL 2 MG/ML IJ SOLN
8.0000 mL/h | INTRAMUSCULAR | Status: DC
  Administered 2018-07-12: 8 mL/h via EPIDURAL
  Filled 2018-07-12 (×2): qty 200

## 2018-07-12 MED ORDER — METOCLOPRAMIDE HCL 5 MG/ML IJ SOLN: 5.0000 mg | Freq: Three times a day (TID) | INTRAMUSCULAR | Status: DC | PRN

## 2018-07-12 MED ORDER — METHOCARBAMOL 500 MG IVPB - SIMPLE MED
INTRAVENOUS | Status: AC
  Filled 2018-07-12: qty 50

## 2018-07-12 MED ORDER — TRANEXAMIC ACID-NACL 1000-0.7 MG/100ML-% IV SOLN
1000.0000 mg | Freq: Once | INTRAVENOUS | Status: AC
  Administered 2018-07-12: 1000 mg via INTRAVENOUS
  Filled 2018-07-12: qty 100

## 2018-07-12 MED ORDER — 0.9 % SODIUM CHLORIDE (POUR BTL) OPTIME
TOPICAL | Status: DC | PRN
  Administered 2018-07-12: 1000 mL

## 2018-07-12 MED ORDER — CEFAZOLIN SODIUM-DEXTROSE 2-4 GM/100ML-% IV SOLN
INTRAVENOUS | Status: AC
  Filled 2018-07-12: qty 100

## 2018-07-12 MED ORDER — PROPOFOL 500 MG/50ML IV EMUL
INTRAVENOUS | Status: DC | PRN
  Administered 2018-07-12: 125 ug/kg/min via INTRAVENOUS

## 2018-07-12 MED ORDER — PHENYLEPHRINE 40 MCG/ML (10ML) SYRINGE FOR IV PUSH (FOR BLOOD PRESSURE SUPPORT)
PREFILLED_SYRINGE | INTRAVENOUS | Status: AC
  Filled 2018-07-12: qty 10

## 2018-07-12 MED ORDER — OXYCODONE HCL 5 MG PO TABS
10.0000 mg | ORAL_TABLET | ORAL | Status: DC | PRN
  Administered 2018-07-14 (×2): 10 mg via ORAL
  Administered 2018-07-15 – 2018-07-17 (×14): 15 mg via ORAL
  Filled 2018-07-12: qty 3
  Filled 2018-07-12: qty 2
  Filled 2018-07-12 (×12): qty 3

## 2018-07-12 MED ORDER — DIPHENHYDRAMINE HCL 12.5 MG/5ML PO ELIX
12.5000 mg | ORAL_SOLUTION | ORAL | Status: DC | PRN
  Administered 2018-07-13: 25 mg via ORAL
  Filled 2018-07-12: qty 10

## 2018-07-12 MED ORDER — METHOCARBAMOL 500 MG IVPB - SIMPLE MED
500.0000 mg | Freq: Four times a day (QID) | INTRAVENOUS | Status: DC | PRN
  Administered 2018-07-12: 500 mg via INTRAVENOUS
  Filled 2018-07-12: qty 50

## 2018-07-12 MED ORDER — HYDROMORPHONE HCL 1 MG/ML IJ SOLN
0.5000 mg | INTRAMUSCULAR | Status: DC | PRN
  Administered 2018-07-14 – 2018-07-16 (×6): 1 mg via INTRAVENOUS
  Filled 2018-07-12 (×6): qty 1

## 2018-07-12 MED ORDER — DEXAMETHASONE SODIUM PHOSPHATE 10 MG/ML IJ SOLN
INTRAMUSCULAR | Status: DC | PRN
  Administered 2018-07-12: 10 mg via INTRAVENOUS

## 2018-07-12 MED ORDER — DOCUSATE SODIUM 100 MG PO CAPS
100.0000 mg | ORAL_CAPSULE | Freq: Two times a day (BID) | ORAL | Status: DC
  Administered 2018-07-12 – 2018-07-17 (×10): 100 mg via ORAL
  Filled 2018-07-12 (×10): qty 1

## 2018-07-12 MED ORDER — ACETAMINOPHEN 10 MG/ML IV SOLN
1000.0000 mg | Freq: Four times a day (QID) | INTRAVENOUS | Status: DC
  Administered 2018-07-12: 1000 mg via INTRAVENOUS
  Filled 2018-07-12: qty 100

## 2018-07-12 MED ORDER — GABAPENTIN 300 MG PO CAPS
300.0000 mg | ORAL_CAPSULE | Freq: Once | ORAL | Status: AC
  Administered 2018-07-12: 300 mg via ORAL
  Filled 2018-07-12: qty 1

## 2018-07-12 MED ORDER — DEXAMETHASONE SODIUM PHOSPHATE 10 MG/ML IJ SOLN
INTRAMUSCULAR | Status: AC
  Filled 2018-07-12: qty 1

## 2018-07-12 MED ORDER — ONDANSETRON HCL 4 MG/2ML IJ SOLN
INTRAMUSCULAR | Status: AC
  Filled 2018-07-12: qty 2

## 2018-07-12 MED ORDER — ONDANSETRON HCL 4 MG/2ML IJ SOLN: 4.0000 mg | Freq: Once | INTRAMUSCULAR | Status: DC | PRN

## 2018-07-12 MED ORDER — CEFAZOLIN SODIUM-DEXTROSE 2-4 GM/100ML-% IV SOLN
2.0000 g | Freq: Four times a day (QID) | INTRAVENOUS | Status: AC
  Administered 2018-07-12 (×2): 2 g via INTRAVENOUS
  Filled 2018-07-12 (×2): qty 100

## 2018-07-12 SURGICAL SUPPLY — 72 items
ATTUNE MED DOME PAT 41 KNEE (Knees) ×4 IMPLANT
ATTUNE MED DOME PAT 41MM KNEE (Knees) ×2 IMPLANT
ATTUNE PS FEM LT CEM SZ9 KNEE (Femur) ×3 IMPLANT
ATTUNE PS FEM RT SZ9 CEM KNEE (Femur) ×3 IMPLANT
ATTUNE PS RP INSR SZ9 8 KNEE (Femur) ×2 IMPLANT
ATTUNE PS RP INSR SZ9 8MM KNEE (Femur) ×1 IMPLANT
ATTUNE PS RP KNEE INSR SZ9 7 (Insert) ×2 IMPLANT
ATTUNE PS RP KNEE INSR SZ9 7MM (Insert) ×1 IMPLANT
BAG ZIPLOCK 12X15 (MISCELLANEOUS) ×6 IMPLANT
BANDAGE ACE 6X5 VEL STRL LF (GAUZE/BANDAGES/DRESSINGS) ×6 IMPLANT
BANDAGE ELASTIC 6 VELCRO ST LF (GAUZE/BANDAGES/DRESSINGS) ×6 IMPLANT
BANDAGE ESMARK 6X9 LF (GAUZE/BANDAGES/DRESSINGS) ×1 IMPLANT
BASE TIBIAL ROT PLAT SZ 8 KNEE (Knees) ×2 IMPLANT
BLADE OSC SAW 13X1.19X90 (BLADE) ×2 IMPLANT
BLADE SURG SZ10 CARB STEEL (BLADE) ×6 IMPLANT
BNDG COHESIVE 6X5 TAN STRL LF (GAUZE/BANDAGES/DRESSINGS) ×3 IMPLANT
BNDG ESMARK 6X9 LF (GAUZE/BANDAGES/DRESSINGS) ×3
BOWL SMART MIX CTS (DISPOSABLE) ×6 IMPLANT
CEMENT HV SMART SET (Cement) ×12 IMPLANT
CLOSURE WOUND 1/2 X4 (GAUZE/BANDAGES/DRESSINGS) ×4
COVER SURGICAL LIGHT HANDLE (MISCELLANEOUS) ×3 IMPLANT
COVER WAND RF STERILE (DRAPES) IMPLANT
CUFF TOURN SGL QUICK 34 (TOURNIQUET CUFF) ×4
CUFF TRNQT CYL 34X4X40X1 (TOURNIQUET CUFF) ×2 IMPLANT
DRAPE EXTREMITY BILATERAL (DRAPES) ×3 IMPLANT
DRAPE INCISE IOBAN 66X45 STRL (DRAPES) ×3 IMPLANT
DRAPE U-SHAPE 47X51 STRL (DRAPES) ×9 IMPLANT
DRSG ADAPTIC 3X8 NADH LF (GAUZE/BANDAGES/DRESSINGS) ×6 IMPLANT
DRSG PAD ABDOMINAL 8X10 ST (GAUZE/BANDAGES/DRESSINGS) ×6 IMPLANT
DURAPREP 26ML APPLICATOR (WOUND CARE) ×6 IMPLANT
ELECT REM PT RETURN 15FT ADLT (MISCELLANEOUS) ×3 IMPLANT
EVACUATOR 1/8 PVC DRAIN (DRAIN) ×6 IMPLANT
FACESHIELD WRAPAROUND (MASK) ×6 IMPLANT
GAUZE SPONGE 4X4 12PLY STRL (GAUZE/BANDAGES/DRESSINGS) ×6 IMPLANT
GLOVE BIO SURGEON STRL SZ 6.5 (GLOVE) ×4 IMPLANT
GLOVE BIO SURGEON STRL SZ8 (GLOVE) ×6 IMPLANT
GLOVE BIO SURGEONS STRL SZ 6.5 (GLOVE) ×2
GLOVE BIOGEL PI IND STRL 7.0 (GLOVE) ×1 IMPLANT
GLOVE BIOGEL PI IND STRL 8 (GLOVE) ×1 IMPLANT
GLOVE BIOGEL PI INDICATOR 7.0 (GLOVE) ×2
GLOVE BIOGEL PI INDICATOR 8 (GLOVE) ×2
GOWN STRL REUS W/TWL LRG LVL3 (GOWN DISPOSABLE) ×6 IMPLANT
HANDPIECE INTERPULSE COAX TIP (DISPOSABLE) ×2
IMMOBILIZER KNEE 20 (SOFTGOODS) ×6 IMPLANT
IMMOBILIZER KNEE 20 THIGH 36 (SOFTGOODS) ×1 IMPLANT
MANIFOLD NEPTUNE II (INSTRUMENTS) ×3 IMPLANT
NS IRRIG 1000ML POUR BTL (IV SOLUTION) ×3 IMPLANT
OSC SAW BLADE 13X1.19X90 (BLADE) ×6
Oscillating Saw Blade ×6 IMPLANT
PACK TOTAL KNEE CUSTOM (KITS) ×3 IMPLANT
PAD ABD 8X10 STRL (GAUZE/BANDAGES/DRESSINGS) ×6 IMPLANT
PADDING CAST COTTON 6X4 STRL (CAST SUPPLIES) ×6 IMPLANT
PIN STEINMAN FIXATION KNEE (PIN) ×3 IMPLANT
PIN THREADED HEADED SIGMA (PIN) ×3 IMPLANT
SAW BLADE OSCILLAT 100X17X1.27 (BLADE) ×3 IMPLANT
SET HNDPC FAN SPRY TIP SCT (DISPOSABLE) ×1 IMPLANT
SPONGE LAP 18X18 RF (DISPOSABLE) ×3 IMPLANT
STOCKINETTE 8 INCH (MISCELLANEOUS) ×3 IMPLANT
STRIP CLOSURE SKIN 1/2X4 (GAUZE/BANDAGES/DRESSINGS) ×8 IMPLANT
SUCTION FRAZIER HANDLE 12FR (TUBING) ×2
SUCTION TUBE FRAZIER 12FR DISP (TUBING) ×1 IMPLANT
SUT MNCRL AB 4-0 PS2 18 (SUTURE) ×6 IMPLANT
SUT STRATAFIX 0 PDS 27 VIOLET (SUTURE) ×3
SUT VIC AB 2-0 CT1 27 (SUTURE) ×12
SUT VIC AB 2-0 CT1 TAPERPNT 27 (SUTURE) ×6 IMPLANT
SUTURE STRATFX 0 PDS 27 VIOLET (SUTURE) ×1 IMPLANT
SYR 50ML LL SCALE MARK (SYRINGE) IMPLANT
TIBIAL BASE ROT PLAT SZ 8 KNEE (Knees) ×6 IMPLANT
TRAY FOLEY MTR SLVR 16FR STAT (SET/KITS/TRAYS/PACK) IMPLANT
WATER STERILE IRR 1000ML POUR (IV SOLUTION) ×6 IMPLANT
WRAP KNEE MAXI GEL POST OP (GAUZE/BANDAGES/DRESSINGS) ×6 IMPLANT
YANKAUER SUCT BULB TIP 10FT TU (MISCELLANEOUS) ×3 IMPLANT

## 2018-07-12 NOTE — Discharge Instructions (Addendum)
Information on my medicine - XARELTO (Rivaroxaban)  This medication education was reviewed with me or my healthcare representative as part of my discharge preparation.   Why was Xarelto prescribed for you? Xarelto was prescribed for you to reduce the risk of blood clots forming after orthopedic surgery. The medical term for these abnormal blood clots is venous thromboembolism (VTE).  What do you need to know about xarelto ? Take your Xarelto ONCE DAILY at the same time every day. You may take it either with or without food.  If you have difficulty swallowing the tablet whole, you may crush it and mix in applesauce just prior to taking your dose.  Take Xarelto exactly as prescribed by your doctor and DO NOT stop taking Xarelto without talking to the doctor who prescribed the medication.  Stopping without other VTE prevention medication to take the place of Xarelto may increase your risk of developing a clot.  After discharge, you should have regular check-up appointments with your healthcare provider that is prescribing your Xarelto.    What do you do if you miss a dose? If you miss a dose, take it as soon as you remember on the same day then continue your regularly scheduled once daily regimen the next day. Do not take two doses of Xarelto on the same day.   Important Safety Information A possible side effect of Xarelto is bleeding. You should call your healthcare provider right away if you experience any of the following: ? Bleeding from an injury or your nose that does not stop. ? Unusual colored urine (red or dark brown) or unusual colored stools (red or black). ? Unusual bruising for unknown reasons. ? A serious fall or if you hit your head (even if there is no bleeding).  Some medicines may interact with Xarelto and might increase your risk of bleeding while on Xarelto. To help avoid this, consult your healthcare provider or pharmacist prior to using any new prescription  or non-prescription medications, including herbals, vitamins, non-steroidal anti-inflammatory drugs (NSAIDs) and supplements.  This website has more information on Xarelto: [email or web address]     Dr. Gaynelle Arabian Total Joint Specialist [facility] [address] [place], [place] [address] [phone]  TOTAL KNEE REPLACEMENT POSTOPERATIVE DIRECTIONS  Knee Rehabilitation, Guidelines Following Surgery  Results after knee surgery are often greatly improved when you follow the exercise, range of motion and muscle strengthening exercises prescribed by your doctor. Safety measures are also important to protect the knee from further injury. Any time any of these exercises cause you to have increased pain or swelling in your knee joint, decrease the amount until you are comfortable again and slowly increase them. If you have problems or questions, call your caregiver or physical therapist for advice.   HOME CARE INSTRUCTIONS   Remove items at home which could result in a fall. This includes throw rugs or furniture in walking pathways.   ICE to the affected knee every three hours for 30 minutes at a time and then as needed for pain and swelling.  Continue to use ice on the knee for pain and swelling from surgery. You may notice swelling that will progress down to the foot and ankle.  This is normal after surgery.  Elevate the leg when you are not up walking on it.    Continue to use the breathing machine which will help keep your temperature down.  It is common for your temperature to cycle up and down following surgery, especially  at night when you are not up moving around and exerting yourself.  The breathing machine keeps your lungs expanded and your temperature down.  Do not place pillow under knee, focus on keeping the knee straight while resting  DIET You may resume your previous home diet once your are discharged from the hospital.  DRESSING / WOUND CARE / SHOWERING You may  shower 3 days after surgery, but keep the wounds dry during showering.  You may use an occlusive plastic wrap (Press'n Seal for example), NO SOAKING/SUBMERGING IN THE BATHTUB.  If the bandage gets wet, change with a clean dry gauze.  If the incision gets wet, pat the wound dry with a clean towel. You may start showering once you are discharged home but do not submerge the incision under water. Just pat the incision dry and apply a dry gauze dressing on daily. Change the surgical dressing daily and reapply a dry dressing each time.  ACTIVITY Walk with your walker as instructed. Use walker as long as suggested by your caregivers. Avoid periods of inactivity such as sitting longer than an hour when not asleep. This helps prevent blood clots.  You may resume a sexual relationship in one month or when given the OK by your doctor.  You may return to work once you are cleared by your doctor.  Do not drive a car for 6 weeks or until released by you surgeon.  Do not drive while taking narcotics.  WEIGHT BEARING Weight bearing as tolerated with assist device (walker, cane, etc) as directed, use it as long as suggested by your surgeon or therapist, typically at least 4-6 weeks.  POSTOPERATIVE CONSTIPATION PROTOCOL Constipation - defined medically as fewer than three stools per week and severe constipation as less than one stool per week.  One of the most common issues patients have following surgery is constipation.  Even if you have a regular bowel pattern at home, your normal regimen is likely to be disrupted due to multiple reasons following surgery.  Combination of anesthesia, postoperative narcotics, change in appetite and fluid intake all can affect your bowels.  In order to avoid complications following surgery, here are some recommendations in order to help you during your recovery period.  Colace (docusate) - Pick up an over-the-counter form of Colace or another stool softener and take twice a day  as long as you are requiring postoperative pain medications.  Take with a full glass of water daily.  If you experience loose stools or diarrhea, hold the colace until you stool forms back up.  If your symptoms do not get better within 1 week or if they get worse, check with your doctor.  Dulcolax (bisacodyl) - Pick up over-the-counter and take as directed by the product packaging as needed to assist with the movement of your bowels.  Take with a full glass of water.  Use this product as needed if not relieved by Colace only.   MiraLax (polyethylene glycol) - Pick up over-the-counter to have on hand.  MiraLax is a solution that will increase the amount of water in your bowels to assist with bowel movements.  Take as directed and can mix with a glass of water, juice, soda, coffee, or tea.  Take if you go more than two days without a movement. Do not use MiraLax more than once per day. Call your doctor if you are still constipated or irregular after using this medication for 7 days in a row.  If you continue to  have problems with postoperative constipation, please contact the office for further assistance and recommendations.  If you experience "the worst abdominal pain ever" or develop nausea or vomiting, please contact the office immediatly for further recommendations for treatment.  ITCHING  If you experience itching with your medications, try taking only a single pain pill, or even half a pain pill at a time.  You can also use Benadryl over the counter for itching or also to help with sleep.   TED HOSE STOCKINGS Wear the elastic stockings on both legs for three weeks following surgery during the day but you may remove then at night for sleeping.  MEDICATIONS See your medication summary on the After Visit Summary that the nursing staff will review with you prior to discharge.  You may have some home medications which will be placed on hold until you complete the course of blood thinner medication.   It is important for you to complete the blood thinner medication as prescribed by your surgeon.  Continue your approved medications as instructed at time of discharge.  PRECAUTIONS If you experience chest pain or shortness of breath - call 911 immediately for transfer to the hospital emergency department.  If you develop a fever greater that 101 F, purulent drainage from wound, increased redness or drainage from wound, foul odor from the wound/dressing, or calf pain - CONTACT YOUR SURGEON.                                                   FOLLOW-UP APPOINTMENTS Make sure you keep all of your appointments after your operation with your surgeon and caregivers. You should call the office at the above phone number and make an appointment for approximately two weeks after the date of your surgery or on the date instructed by your surgeon outlined in the "After Visit Summary".   RANGE OF MOTION AND STRENGTHENING EXERCISES  Rehabilitation of the knee is important following a knee injury or an operation. After just a few days of immobilization, the muscles of the thigh which control the knee become weakened and shrink (atrophy). Knee exercises are designed to build up the tone and strength of the thigh muscles and to improve knee motion. Often times heat used for twenty to thirty minutes before working out will loosen up your tissues and help with improving the range of motion but do not use heat for the first two weeks following surgery. These exercises can be done on a training (exercise) mat, on the floor, on a table or on a bed. Use what ever works the best and is most comfortable for you Knee exercises include:   Leg Lifts - While your knee is still immobilized in a splint or cast, you can do straight leg raises. Lift the leg to 60 degrees, hold for 3 sec, and slowly lower the leg. Repeat 10-20 times 2-3 times daily. Perform this exercise against resistance later as your knee gets better.   Quad and  Hamstring Sets - Tighten up the muscle on the front of the thigh (Quad) and hold for 5-10 sec. Repeat this 10-20 times hourly. Hamstring sets are done by pushing the foot backward against an object and holding for 5-10 sec. Repeat as with quad sets.   Leg Slides: Lying on your back, slowly slide your foot toward your buttocks, bending your  knee up off the floor (only go as far as is comfortable). Then slowly slide your foot back down until your leg is flat on the floor again.  Angel Wings: Lying on your back spread your legs to the side as far apart as you can without causing discomfort.  A rehabilitation program following serious knee injuries can speed recovery and prevent re-injury in the future due to weakened muscles. Contact your doctor or a physical therapist for more information on knee rehabilitation.   IF YOU ARE TRANSFERRED TO A SKILLED REHAB FACILITY If the patient is transferred to a skilled rehab facility following release from the hospital, a list of the current medications will be sent to the facility for the patient to continue.  When discharged from the skilled rehab facility, please have the facility set up the patient's Home Health Physical Therapy prior to being released. Also, the skilled facility will be responsible for providing the patient with their medications at time of release from the facility to include their pain medication, the muscle relaxants, and their blood thinner medication. If the patient is still at the rehab facility at time of the two week follow up appointment, the skilled rehab facility will also need to assist the patient in arranging follow up appointment in our office and any transportation needs.  MAKE SURE YOU:   Understand these instructions.   Get help right away if you are not doing well or get worse.    Pick up stool softner and laxative for home use following surgery while on pain medications. Do not submerge incision under water. Please use  good hand washing techniques while changing dressing each day. May shower starting three days after surgery. Please use a clean towel to pat the incision dry following showers. Continue to use ice for pain and swelling after surgery. Do not use any lotions or creams on the incision until instructed by your surgeon.

## 2018-07-12 NOTE — Transfer of Care (Signed)
Immediate Anesthesia Transfer of Care Note  Patient: Franklin Lucas  Procedure(s) Performed: Bilateral total knee arthroplasty (Bilateral Knee)  Patient Location: PACU  Anesthesia Type:Spinal and Epidural  Level of Consciousness: sedated  Airway & Oxygen Therapy: Patient Spontanous Breathing and Patient connected to face mask oxygen  Post-op Assessment: Report given to RN and Post -op Vital signs reviewed and stable  Post vital signs: Reviewed and stable  Last Vitals:  Vitals Value Taken Time  BP 119/69 07/12/2018 12:54 PM  Temp    Pulse 73 07/12/2018 12:55 PM  Resp 16 07/12/2018 12:55 PM  SpO2 100 % 07/12/2018 12:55 PM  Vitals shown include unvalidated device data.  Last Pain:  Vitals:   07/12/18 0848  TempSrc:   PainSc: 0-No pain      Patients Stated Pain Goal: 3 (07/12/18 0848)  Complications: No apparent anesthesia complications

## 2018-07-12 NOTE — Anesthesia Procedure Notes (Signed)
Epidural Patient location during procedure: OR Start time: 07/12/2018 9:50 AM End time: 07/12/2018 10:00 AM  Staffing Anesthesiologist: Leonides Grills, MD Performed: anesthesiologist   Preanesthetic Checklist Completed: patient identified, site marked, pre-op evaluation, timeout performed, IV checked, risks and benefits discussed and monitors and equipment checked  Epidural Patient position: sitting Prep: DuraPrep Patient monitoring: heart rate, cardiac monitor, continuous pulse ox and blood pressure Approach: midline Location: L4-L5 Injection technique: LOR air  Needle:  Needle type: Other (Espocan)  Needle gauge: 17 G Needle length: 9 cm Needle insertion depth: 8 cm Catheter type: closed end flexible Catheter size: 19 Gauge Catheter at skin depth: 12 cm Test dose: negative and 2% lidocaine with Epi 1:200 K  Assessment Events: blood not aspirated, injection not painful, no injection resistance and negative IV test  Additional Notes Informed consent obtained prior to proceeding including risk of failure, 1% risk of PDPH, risk of minor discomfort and bruising. Discussed alternatives to epidural analgesia and patient desires to proceed.  Timeout performed pre-procedure verifying patient name, procedure, and platelet count.  Patient tolerated procedure well. Reason for block:at surgeon's request and post-op pain management

## 2018-07-12 NOTE — Telephone Encounter (Signed)
-----   Message from Parke Poisson, MD sent at 07/12/2018  5:48 AM EDT ----- Normal imaging on stress echo, intermediate CV risk for anesthesia with no other modifiable risk factors. He is optimized from a CV perspective.  I have communicated this to Dr. Lequita Halt by routing message. Can you please call the patient and share with him that the stress echo pictures are normal, and there is nothing else we need to do from a CV perspective prior to surgery.

## 2018-07-12 NOTE — Anesthesia Postprocedure Evaluation (Signed)
Anesthesia Post Note  Patient: Franklin Lucas  Procedure(s) Performed: Bilateral total knee arthroplasty (Bilateral Knee)     Patient location during evaluation: PACU Anesthesia Type: Combined Spinal/Epidural Level of consciousness: oriented and awake and alert Pain management: pain level controlled Vital Signs Assessment: post-procedure vital signs reviewed and stable Respiratory status: spontaneous breathing, respiratory function stable and patient connected to nasal cannula oxygen Cardiovascular status: blood pressure returned to baseline and stable Postop Assessment: no headache, no backache, no apparent nausea or vomiting and spinal receding Anesthetic complications: no    Last Vitals:  Vitals:   07/12/18 1600 07/12/18 1627  BP:  130/87  Pulse:  66  Resp: 16 16  Temp:  36.5 C  SpO2: 98% 100%    Last Pain:  Vitals:   07/12/18 1627  TempSrc: Oral  PainSc:                  Ryan P Ellender

## 2018-07-12 NOTE — Interval H&P Note (Signed)
History and Physical Interval Note:  07/12/2018 8:30 AM  Franklin Lucas  has presented today for surgery, with the diagnosis of bilateral knee osteoarthritis  The various methods of treatment have been discussed with the patient and family. After consideration of risks, benefits and other options for treatment, the patient has consented to  Procedure(s) with comments: Bilateral total knee arthroplasty (Bilateral) - as a surgical intervention .  The patient's history has been reviewed, patient examined, no change in status, stable for surgery.  I have reviewed the patient's chart and labs.  Questions were answered to the patient's satisfaction.     [name]

## 2018-07-12 NOTE — Op Note (Signed)
Pre-operative diagnosis- Osteoarthritis  Bilateral knee(s)  Post-operative diagnosis- Osteoarthritis Bilateral knee(s)  Procedure-  Bilateral  Total Knee Arthroplasty  Surgeon- Gus Rankin. Briah Nary, MD  Assistant- Arther Abbott, PA-C   Anesthesia-  Spinal and Epidural  EBL-50 mL   Drains Hemovac x 1 each side  Tourniquet time-  Total Tourniquet Time Documented: Thigh (Left) - 49 minutes Total: Thigh (Left) - 49 minutes  Thigh (Right) - 45 minutes Total: Thigh (Right) - 45 minutes     Complications- None  Condition-PACU - hemodynamically stable.   Brief Clinical Note  Franklin Lucas is a 62 y.o. year old male with end stage OA of both knees with progressively worsening pain and dysfunction. The patient has constant pain, with activity and at rest and significant functional deficits with difficulties even with ADLs. The patient has had extensive non-op management including analgesics, injections of cortisone and viscosupplements, and home exercise program, but remains in significant pain with significant dysfunction. We discussed replacing both knees in the same setting versus one at a time including procedure, risks, potential complications, rehab course, and pros and cons associated with each and the patient elects to do both knees at the same time. The patient presents now for bilateral Total Knee Arthroplasty.     Procedure in detail---   The patient is brought into the operating room and positioned supine on the operating table. After successful administration of  Spinal and Epidural,   a tourniquet is placed high on the  Bilateral thigh(s) and the lower extremities are prepped and draped in the usual sterile fashion. Time out is performed by the operating team and then the  Left lower extremity is wrapped in Esmarch, knee flexed and the tourniquet inflated to 300 mmHg.       A midline incision is made with a ten blade through the subcutaneous tissue to the level of the extensor  mechanism. A fresh blade is used to make a medial parapatellar arthrotomy. Soft tissue over the proximal medial tibia is subperiosteally elevated to the joint line with a knife and into the semimembranosus bursa with a Cobb elevator. Soft tissue over the proximal lateral tibia is elevated with attention being paid to avoiding the patellar tendon on the tibial tubercle. The patella is everted, knee flexed 90 degrees and the ACL and PCL are removed. Findings are bone on bone medial and patellofemoral with large global osteophytes.        The drill is used to create a starting hole in the distal femur and the canal is thoroughly irrigated with sterile saline to remove the fatty contents. The 5 degree Left  valgus alignment guide is placed into the femoral canal and the distal femoral cutting block is pinned to remove 9 mm off the distal femur. Resection is made with an oscillating saw.      The tibia is subluxed forward and the menisci are removed. The extramedullary alignment guide is placed referencing proximally at the medial aspect of the tibial tubercle and distally along the second metatarsal axis and tibial crest. The block is pinned to remove 2mm off the more deficient medial  side. Resection is made with an oscillating saw. Size 8is the most appropriate size for the tibia and the proximal tibia is prepared with the modular drill and keel punch for that size.      The femoral sizing guide is placed and size 9 is most appropriate. Rotation is marked off the epicondylar axis and confirmed by creating a rectangular flexion  gap at 90 degrees. The size 9 cutting block is pinned in this rotation and the anterior, posterior and chamfer cuts are made with the oscillating saw. The intercondylar block is then placed and that cut is made.      Trial size 8 tibial component, trial size 9 posterior stabilized femur and a 8  mm posterior stabilized rotating platform insert trial is placed. Full extension is achieved with  excellent varus/valgus and anterior/posterior balance throughout full range of motion. The patella is everted and thickness measured to be 27  mm. Free hand resection is taken to 15 mm, a 41 template is placed, lug holes are drilled, trial patella is placed, and it tracks normally. Osteophytes are removed off the posterior femur with the trial in place. All trials are removed and the cut bone surfaces prepared with pulsatile lavage. Cement is mixed and once ready for implantation, the size 8 tibial implant, size  9 posterior stabilized femoral component, and the size 41 patella are cemented in place and the patella is held with the clamp. The trial insert is placed and the knee held in full extension.  All extruded cement is removed and once the cement is hard the permanent 8 mm posterior stabilized rotating platform insert is placed into the tibial tray.      The wound is copiously irrigated with saline solution and the extensor mechanism closed over a hemovac drain with #1 V-loc suture. The tourniquet is released for a total tourniquet time of 49  minutes. Flexion against gravity is 140 degrees and the patella tracks normally. Subcutaneous tissue is closed with 2.0 vicryl and subcuticular with running 4.0 Monocryl.       The  Right lower extremity is wrapped in Esmarch, knee flexed and the tourniquet inflated to 300 mmHg.       A midline incision is made with a ten blade through the subcutaneous tissue to the level of the extensor mechanism. A fresh blade is used to make a medial parapatellar arthrotomy. Soft tissue over the proximal medial tibia is subperiosteally elevated to the joint line with a knife and into the semimembranosus bursa with a Cobb elevator. Soft tissue over the proximal lateral tibia is elevated with attention being paid to avoiding the patellar tendon on the tibial tubercle. The patella is everted, knee flexed 90 degrees and the ACL and PCL are removed. Findings are bone on bone medial and  patellofemoral with large global osteophytes        The drill is used to create a starting hole in the distal femur and the canal is thoroughly irrigated with sterile saline to remove the fatty contents. The 5 degree Right  valgus alignment guide is placed into the femoral canal and the distal femoral cutting block is pinned to remove 9 mm off the distal femur. Resection is made with an oscillating saw.      The tibia is subluxed forward and the menisci are removed. The extramedullary alignment guide is placed referencing proximally at the medial aspect of the tibial tubercle and distally along the second metatarsal axis and tibial crest. The block is pinned to remove 2mm off the more deficient medial  side. Resection is made with an oscillating saw. Size 8is the most appropriate size for the tibia and the proximal tibia is prepared with the modular drill and keel punch for that size.      The femoral sizing guide is placed and size 9 is most appropriate. Rotation is marked off  the epicondylar axis and confirmed by creating a rectangular flexion gap at 90 degrees. The size 9 cutting block is pinned in this rotation and the anterior, posterior and chamfer cuts are made with the oscillating saw. The intercondylar block is then placed and that cut is made.      Trial size 8 tibial component, trial size 9 posterior stabilized femur and a 7  mm posterior stabilized rotating platform insert trial is placed. Full extension is achieved with excellent varus/valgus and anterior/posterior balance throughout full range of motion. The patella is everted and thickness measured to be 27  mm. Free hand resection is taken to 15 mm, a 41 template is placed, lug holes are drilled, trial patella is placed, and it tracks normally. Osteophytes are removed off the posterior femur with the trial in place. All trials are removed and the cut bone surfaces prepared with pulsatile lavage. Cement is mixed and once ready for implantation,  the size 8 tibial implant, size  9 posterior stabilized femoral component, and the size 41 patella are cemented in place and the patella is held with the clamp. The trial insert is placed and the knee held in full extension.   All extruded cement is removed and once the cement is hard the permanent 7 mm posterior stabilized rotating platform insert is placed into the tibial tray.      The wound is copiously irrigated with saline solution and the extensor mechanism closed over a hemovac drain with #1 V-loc suture. The tourniquet is released for a total tourniquet time of 44  minutes. Flexion against gravity is 140 degrees and the patella tracks normally. Subcutaneous tissue is closed with 2.0 vicryl and subcuticular with running 4.0 Monocryl. The incisions are cleaned and dried and steri-strips and  bulky sterile dressings are applied. The limbs are placed into knee immobilizers and the patient is awakened and transported to recovery in stable condition.            Please note that a surgical assistant was a medical necessity for this procedure in order to perform it in a safe and expeditious manner. Surgical assistant was necessary to retract the ligaments and vital neurovascular structures to prevent injury to them and also necessary for proper positioning of the limb to allow for anatomic placement of the prosthesis.   Gus Rankin Chenel Wernli, MD    07/12/2018, 12:32 PM

## 2018-07-12 NOTE — Telephone Encounter (Signed)
Pt aware of stress echo results with verbal understanding. Pt voiced appreciation for the call. Results faxed to Dr.Alusio via Epic

## 2018-07-12 NOTE — Anesthesia Procedure Notes (Signed)
Spinal  Patient location during procedure: OR Start time: 07/12/2018 9:50 AM End time: 07/12/2018 10:00 AM Staffing Anesthesiologist: Leonides Grills, MD Performed: anesthesiologist  Preanesthetic Checklist Completed: patient identified, surgical consent, pre-op evaluation, timeout performed, IV checked, risks and benefits discussed and monitors and equipment checked Spinal Block Patient position: sitting Prep: DuraPrep Patient monitoring: cardiac monitor, continuous pulse ox and blood pressure Approach: midline Location: L4-5 Injection technique: single-shot Needle Needle type: Pencan  Needle gauge: 25 G Needle length: 12.7 cm Assessment Sensory level: T10 Additional Notes Functioning IV was confirmed and monitors were applied. Sterile prep and drape, including hand hygiene and sterile gloves were used. The patient was positioned and the spine was prepped. The skin was anesthetized with lidocaine.  Free flow of clear CSF was obtained prior to injecting local anesthetic into the CSF.  The spinal needle aspirated freely following injection.  The needle was carefully withdrawn.  The patient tolerated the procedure well.

## 2018-07-12 NOTE — Anesthesia Preprocedure Evaluation (Addendum)
Anesthesia Evaluation  Patient identified by MRN, date of birth, ID band Patient awake    Reviewed: Allergy & Precautions, NPO status , Patient's Chart, lab work & pertinent test results  Airway Mallampati: II  TM Distance: >3 FB Neck ROM: Full    Dental no notable dental hx.    Pulmonary former smoker,    Pulmonary exam normal breath sounds clear to auscultation       Cardiovascular negative cardio ROS Normal cardiovascular exam Rhythm:Regular Rate:Normal  Grossly normal imaging with treadmill stress echocardiogram. Moderate cardiovascular risk by Duke Treadmill Score.  ECHO: LV EF: 60% -   65%   Neuro/Psych negative neurological ROS  negative psych ROS   GI/Hepatic negative GI ROS, Neg liver ROS,   Endo/Other  negative endocrine ROS  Renal/GU negative Renal ROS     Musculoskeletal  (+) Arthritis , Osteoarthritis,  Low back pain   Abdominal (+) + obese,   Peds  Hematology negative hematology ROS (+)   Anesthesia Other Findings Bilateral knee osteoarthritis  Reproductive/Obstetrics                           Anesthesia Physical Anesthesia Plan  ASA: II  Anesthesia Plan: Combined Spinal and Epidural   Post-op Pain Management:    Induction:   PONV Risk Score and Plan: 1 and Propofol infusion, Treatment may vary due to age or medical condition, Dexamethasone, Ondansetron and Midazolam  Airway Management Planned: Natural Airway  Additional Equipment:   Intra-op Plan:   Post-operative Plan:   Informed Consent: I have reviewed the patients History and Physical, chart, labs and discussed the procedure including the risks, benefits and alternatives for the proposed anesthesia with the patient or authorized representative who has indicated his/her understanding and acceptance.   Dental advisory given  Plan Discussed with: CRNA  Anesthesia Plan Comments:        Anesthesia  Quick Evaluation

## 2018-07-12 NOTE — Consult Note (Addendum)
Cardiology Office Note:    Date:  07/12/2018   ID:  Shepard Keltz, DOB 04-10-1956, MRN [id number]  PCP:  Janece Canterbury, MD  Cardiologist:  No primary care provider on file.  Electrophysiologist:  None   Referring MD: Janece Canterbury, MD   Fatigue and syncope  History of Present Illness:    Franklin Lucas is a 62 y.o. male with a hx of hyperlipidemia, OA of the bilateral knees who presents today for preoperative cardiovascular exam.  He is planning to have bilateral knee replacement with Dr. Lequita Halt on July 12, 2018.  He has a history of 2 lifetime syncopal episodes.  One occurred recently and one occurred 39 years ago after prolonged sleep deprivation and travel while he was in the [employer].  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  He denies significant resting chest pain but does endorse chest discomfort that can occur after 25 minutes of exercise.   He denies chest pain, chest pressure, dyspnea at rest, palpitations, PND, orthopnea, or leg swelling. Denies presyncope. Denies dizziness or lightheadedness.  He has an Epworth Sleepiness Scale score of 7.      Past Medical History:  Diagnosis Date  . Hemorrhoid 11/2016  . Hyperlipidemia   . Low back pain 02/22/2013  . OA (osteoarthritis)   . Syncope 06/16/2018    Past Surgical History:  Procedure Laterality Date  . CARPAL TUNNEL RELEASE Right   . COLONOSCOPY  2011  . dental implants    . KNEE SURGERY Right 02/22/1997  . LT arm surgery- due to lacerations    . REFRACTIVE SURGERY Bilateral     Current Medications: Current Meds  Medication Sig  . Artificial Tear Ointment (DRY EYES OP) Place 1 drop into both eyes 2 (two) times daily.  . Flaxseed, Linseed, (FLAX SEED OIL PO) Take 1 capsule by mouth daily.  Marland Kitchen glucosamine-chondroitin 500-400 MG tablet Take 1 tablet by mouth daily.  . Multiple Vitamins-Minerals (MULTIVITAMIN PO) Take 1 tablet by mouth daily.  . naproxen sodium (ALEVE) 220 MG tablet Take 440 mg by mouth  daily as needed (for pain or headache).  . Omega-3 Fatty Acids (FISH OIL) 1000 MG CAPS Take 1,000 mg by mouth daily.  . Red Yeast Rice 600 MG CAPS Take 600 mg by mouth daily.  . vitamin C (ASCORBIC ACID) 500 MG tablet Take 500 mg by mouth daily.     Allergies:   Patient has no known allergies.   Social History   Socioeconomic History  . Marital status: Married    Spouse name: Not on file  . Number of children: Not on file  . Years of education: Not on file  . Highest education level: Not on file  Occupational History  . Not on file  Social Needs  . Financial resource strain: Not on file  . Food insecurity:    Worry: Not on file    Inability: Not on file  . Transportation needs:    Medical: Not on file    Non-medical: Not on file  Tobacco Use  . Smoking status: Former Games developer  . Smokeless tobacco: Never Used  Substance and Sexual Activity  . Alcohol use: No  . Drug use: No  . Sexual activity: Not on file  Lifestyle  . Physical activity:    Days per week: Not on file    Minutes per session: Not on file  . Stress: Not on file  Relationships  . Social connections:    Talks on phone: Not on file  Gets together: Not on file    Attends religious service: Not on file    Active member of club or organization: Not on file    Attends meetings of clubs or organizations: Not on file    Relationship status: Not on file  Other Topics Concern  . Not on file  Social History Narrative  . Not on file     Family History: The patient's family history includes Alcohol abuse in his paternal grandfather; Cancer in his mother; Emphysema in his maternal grandfather; Stroke in his paternal grandfather and sister.  ROS:   Please see the history of present illness.     All other systems reviewed and are negative.  EKGs/Labs/Other Studies Reviewed:    The following studies were reviewed today:   EKG:  EKG is  ordered today.  The ekg ordered today demonstrates normal sinus rhythm,  inferior infarct age-indeterminate with possible Q waves in lead III and aVF.  Recent Labs: 06/16/2018: ALT 20; BUN 15; Creatinine, Ser 0.98; Hemoglobin 12.7; Magnesium 2.0; Platelets 195; Potassium 4.1; Sodium 137  Recent Lipid Panel No results found for: CHOL, TRIG, HDL, CHOLHDL, VLDL, LDLCALC, LDLDIRECT  Physical Exam:    VS:  BP 128/74 (BP Location: Right Arm, Patient Position: Sitting, Cuff Size: Large)   Pulse 74   Ht 6\' 5"  (1.956 m)   Wt 279 lb 3.2 oz (126.6 kg)   BMI 33.11 kg/m     Wt Readings from Last 3 Encounters:  07/07/18 279 lb 3.2 oz (126.6 kg)  07/06/18 281 lb 4 oz (127.6 kg)  06/16/18 275 lb (124.7 kg)     GEN:  Well nourished, well developed in no acute distress HEENT: Normal NECK: No JVD; No carotid bruits LYMPHATICS: No lymphadenopathy CARDIAC: RRR, no murmurs, rubs, gallops RESPIRATORY:  Clear to auscultation without rales, wheezing or rhonchi  ABDOMEN: Soft, non-tender, non-distended MUSCULOSKELETAL:  No edema; No deformity  SKIN: Warm and dry NEUROLOGIC:  Alert and oriented x 3 PSYCHIATRIC:  Normal affect   ASSESSMENT:    1. Preoperative cardiovascular examination    PLAN:    In order of problems listed above:  Franklin Lucas has likely experienced neurocardiogenic syncope twice in his lifetime without concerning cardiovascular features.  He does have mild chest discomfort that occurs well into exercise.  The patient is physically fit and has an exercise capacity well over 4 months.  He does not have an echocardiogram on file, and with his syncope and mild chest discomfort with exertion I would like to rule out any concerns of hypertrophic cardiomyopathy or wall motion abnormalities.  I will obtain a stress echocardiogram with treadmill exercise.  This test is planned for October 29, the day prior to his surgery.  I will plan to review the results of the formal interpretation and communicate with the surgical team as necessary.  Medication  Adjustments/Labs and Tests Ordered: Current medicines are reviewed at length with the patient today.  Concerns regarding medicines are outlined above.  Orders Placed This Encounter  Procedures  . EKG 12-Lead  . ECHOCARDIOGRAM STRESS TEST   No orders of the defined types were placed in this encounter.   Patient Instructions  Medication Instructions:  Your physician recommends that you continue on your current medications as directed. Please refer to the Current Medication list given to you today.  If you need a refill on your cardiac medications before your next appointment, please call your pharmacy.   Lab work: None ordered  Testing/Procedures: Your physician  has requested that you have a stress echocardiogram. For further information please visit [email or web address] Please follow instruction sheet as given.  Your test is scheduled on 07/11/18 @2pm  please arrive at 1:45pm [facility] Phone [phone]  Follow-Up: At [facility], you and your health needs are our priority.  As part of our continuing mission to provide you with exceptional heart care, we have created designated Provider Care Teams.  These Care Teams include your primary Cardiologist (physician) and Advanced Practice Providers (APPs -  Physician Assistants and Nurse Practitioners) who all work together to provide you with the care you need, when you need it. You will need a follow up appointment as needed pending results.     Any Other Special Instructions Will Be Listed Below (If Applicable).   Exercise Stress Echocardiogram An exercise stress echocardiogram is a test to check how well your heart is working. This test uses sound waves (ultrasound) and a computer to make images of your heart before and after exercise. Ultrasound images that are taken before you exercise (your resting echocardiogram) will show how much blood is getting to your heart muscle and how well  your heart muscle and heart valves are functioning. During the next part of this test, you will walk on a treadmill or ride a stationary bike to see how exercise affects your heart. While you exercise, the electrical activity of your heart will be monitored with an electrocardiogram (ECG). Your blood pressure will also be monitored. You may have this test if you:  Have chest pain or other symptoms of a heart problem.  Recently had a heart attack or heart surgery.  Have heart valve problems.  Have a condition that causes narrowing of the blood vessels that supply your heart (coronary artery disease).  Have a high risk of heart disease and are starting a new exercise program.  Have a high risk of heart disease and need to have major surgery.  Tell a health care provider about:  Any allergies you have.  All medicines you are taking, including vitamins, herbs, eye drops, creams, and over-the-counter medicines.  Any problems you or family members have had with anesthetic medicines.  Any blood disorders you have.  Any surgeries you have had.  Any medical conditions you have.  Whether you are pregnant or may be pregnant. What are the risks? Generally, this is a safe procedure. However, problems may occur, including:  Chest pain.  Dizziness or light-headedness.  Shortness of breath.  Increased or irregular heartbeat (palpitations).  Nausea or vomiting.  Heart attack (very rare).  What happens before the procedure?  Follow instructions from your health care provider about eating or drinking restrictions. You may be asked to avoid all forms of caffeine for 24 hours before your procedure, or as told by your health care provider.  Ask your health care provider about changing or stopping your regular medicines. This is especially important if you are taking diabetes medicines or blood thinners.  If you use an inhaler, bring it with you to the test.  Wear loose, comfortable  clothing and walking shoes.  Do notuse any products that contain nicotine or tobacco, such as cigarettes and e-cigarettes, for 4 hours before the test or as told by your health care provider. If you need help quitting, ask your health care provider. What happens during the procedure?  You will take off your clothes from the waist up and put on a hospital gown.  A  technician will place electrodes on your chest.  A blood pressure cuff will be placed on your arm.  You will lie down on a table for an ultrasound exam before you exercise. Gel will be rubbed on your chest, and a handheld device (transducer) will be pressed against your chest and moved over your heart.  Then, you will start exercising by walking on a treadmill or pedaling a stationary bicycle.  Your blood pressure and heart rhythm will be monitored while you exercise.  The exercise will gradually get harder or faster.  You will exercise until: ? Your heart reaches a target level. ? You are too tired to continue. ? You cannot continue because of chest pain, weakness, or dizziness.  You will have another ultrasound exam after you stop exercising. The procedure may vary among health care providers and hospitals. What happens after the procedure?  Your heart rate and blood pressure will be monitored until they return to your normal levels. Summary  An exercise stress echocardiogram is a test that uses ultrasound to check how well your heart works before and after exercise.  Before the test, follow instructions from your health care provider about stopping medications, avoiding nicotine and tobacco, and avoiding certain foods and drinks.  During the test, your blood pressure and heart rhythm will be monitored while you exercise on a treadmill or stationary bicycle. This information is not intended to replace advice given to you by your health care provider. Make sure you discuss any questions you have with your health care  provider. Document Released: 09/03/2004 Document Revised: 04/21/2016 Document Reviewed: 04/21/2016 Elsevier Interactive Patient Education  2018 [employer].     Signed, Parke Poisson, MD  07/12/2018 4:35 AM    [facility]  ADDENDUM:  I have reviewed his stress echocardiogram.  Technical quality of the study is poor without the use of IV Definity left ventricular enhancement.  However there are no definite wall motion abnormalities at rest or with stress.  Specifically I do not see inferior wall hypokinesis given the ECG with inferior Q waves.  I also do not see anterior wall or septal wall motion abnormalities with stress corresponding with the ST elevations in aVR on his stress electrocardiogram.  He augments his ejection fraction from approximately 60 to 70%.  There is no evidence of ischemic dilatation, his end-systolic dimension decreases appropriately with stress.  With a Duke treadmill score of 2, he is intermediate cardiovascular risk. His Chales Abrahams score is 0.04% of a perioperative MI or cardiovascular event.  My overall impression is that he is intermediate cardiovascular risk for an intermediate CV risk surgery, with no other modifiable CV risk factors. He is optimized from a cardiovascular standpoint. These results will be communicated to the patient prior to surgery.

## 2018-07-13 ENCOUNTER — Encounter (HOSPITAL_COMMUNITY): Payer: Self-pay | Admitting: Anesthesiology

## 2018-07-13 LAB — BASIC METABOLIC PANEL
Anion gap: 4 — ABNORMAL LOW (ref 5–15)
BUN: 13 mg/dL (ref 6–20)
CO2: 25 mmol/L (ref 22–32)
Calcium: 8.6 mg/dL — ABNORMAL LOW (ref 8.9–10.3)
Chloride: 110 mmol/L (ref 98–111)
Creatinine, Ser: 0.85 mg/dL (ref 0.61–1.24)
GFR calc Af Amer: >60 mL/min (ref >60)
Glucose, Bld: 160 mg/dL — ABNORMAL HIGH (ref 70–99)
POTASSIUM: 4.1 mmol/L (ref 3.5–5.1)
Sodium: 139 mmol/L (ref 135–145)

## 2018-07-13 LAB — CBC
HEMATOCRIT: 34.5 % — AB (ref 39.0–52.0)
Hemoglobin: 11.4 g/dL — ABNORMAL LOW (ref 13.0–17.0)
MCH: 30.6 pg (ref 26.0–34.0)
MCHC: 33 g/dL (ref 30.0–36.0)
MCV: 92.5 fL (ref 80.0–100.0)
PLATELETS: 194 10*3/uL (ref 150–400)
RBC: 3.73 MIL/uL — ABNORMAL LOW (ref 4.22–5.81)
RDW: 11.7 % (ref 11.5–15.5)
WBC: 12.6 10*3/uL — ABNORMAL HIGH (ref 4.0–10.5)
nRBC: 0 % (ref 0.0–0.2)

## 2018-07-13 LAB — PROTIME-INR
INR: 0.97
Prothrombin Time: 12.8 seconds (ref 11.4–15.2)

## 2018-07-13 MED ORDER — METHOCARBAMOL 500 MG PO TABS: 500.0000 mg | ORAL_TABLET | Freq: Four times a day (QID) | ORAL | 0 refills | Status: DC | PRN

## 2018-07-13 MED ORDER — TRAMADOL HCL 50 MG PO TABS: 50.0000 mg | ORAL_TABLET | Freq: Four times a day (QID) | ORAL | 0 refills | Status: DC | PRN

## 2018-07-13 MED ORDER — WARFARIN SODIUM 2.5 MG PO TABS
2.5000 mg | ORAL_TABLET | Freq: Once | ORAL | Status: AC
  Administered 2018-07-13: 2.5 mg via ORAL
  Filled 2018-07-13: qty 1

## 2018-07-13 MED ORDER — ROPIVACAINE HCL 2 MG/ML IJ SOLN
10.0000 mL/h | INTRAMUSCULAR | Status: DC
  Administered 2018-07-14: 10 mL/h via EPIDURAL
  Filled 2018-07-13 (×3): qty 200

## 2018-07-13 MED ORDER — WARFARIN - PHARMACIST DOSING INPATIENT: Freq: Every day | Status: DC

## 2018-07-13 MED ORDER — OXYCODONE HCL 5 MG PO TABS: 5.0000 mg | ORAL_TABLET | Freq: Four times a day (QID) | ORAL | 0 refills | Status: DC | PRN

## 2018-07-13 MED ORDER — ROPIVACAINE HCL 2 MG/ML IJ SOLN
12.0000 mL/h | INTRAMUSCULAR | Status: DC
  Filled 2018-07-13: qty 200

## 2018-07-13 NOTE — Anesthesia Post-op Follow-up Note (Signed)
  Anesthesia Pain Follow-up Note  Patient: Franklin Lucas  Day #: 1  Date of Follow-up: 07/13/2018 Time: 10:19 AM  Last Vitals:  Vitals:   07/13/18 0705 07/13/18 0954  BP:  117/64  Pulse:  75  Resp: 16   Temp:  36.6 C  SpO2: 92% 98%    Level of Consciousness: alert  Pain: mild   Side Effects:None  Catheter Site Exam:clean  Anti-Coag Meds (From admission, onward)   Start     Dose/Rate Route Frequency Ordered Stop   07/13/18 0900  Warfarin - Pharmacist Dosing Inpatient      Does not apply Daily-1800 07/13/18 0706      Epidural / Intrathecal (From admission, onward)   Start     Dose/Rate Route Frequency Ordered Stop   07/13/18 1015  ropivacaine (PF) 2 mg/mL (0.2%) (NAROPIN) injection     12 mL/hr 12 mL/hr  Epidural Continuous 07/13/18 1010         Plan: Modify therapy to improve pain control at surgeon's request.  10ml bolus of 0.25% Bupivacaine and increase rate to 45ml/hr  [employer]

## 2018-07-13 NOTE — Progress Notes (Signed)
ANTICOAGULATION CONSULT NOTE - Initial Consult  Pharmacy Consult for warfarin Indication: VTE prophylaxis  No Known Allergies  Patient Measurements: Height: 6\' 5"  (195.6 cm) Weight: 278 lb (126.1 kg) IBW/kg (Calculated) : 89.1  Vital Signs: Temp: 98.3 F (36.8 C) (10/31 0510) Temp Source: Oral (10/31 0510) BP: 115/69 (10/31 0510) Pulse Rate: 75 (10/31 0510)  Labs: Recent Labs    07/13/18 0505  HGB 11.4*  HCT 34.5*  PLT 194  LABPROT 12.8  INR 0.97  CREATININE 0.85    Estimated Creatinine Clearance: 135.8 mL/min (by C-G formula based on SCr of 0.85 mg/dL).   Medical History: Past Medical History:  Diagnosis Date  . Hemorrhoid 11/2016  . Hyperlipidemia   . Low back pain 02/22/2013  . OA (osteoarthritis)   . Syncope 06/16/2018    Medications:  Scheduled:  . acetaminophen  1,000 mg Oral Q6H  . dexamethasone  10 mg Intravenous Once  . docusate sodium  100 mg Oral BID   Infusions:  . sodium chloride 100 mL/hr at 07/13/18 0600  . methocarbamol (ROBAXIN) IV Stopped (07/12/18 1430)  . ropivacaine (PF) 2 mg/mL (0.2%) 10 mL/hr (07/13/18 0600)    Assessment: 62 yo s/p Bilateral TKA currently on epidural post op to start per ortho Md orders warfarin this AM at 0900. Baseline CBC and INR good.   Goal of Therapy:  INR 2-3 Monitor platelets by anticoagulation protocol: Yes   Plan:  1) With epidural still in place, will dose reduce warfarin per protocol and give 2.5mg  this AM 2) Daily INR   Hessie Knows, PharmD, BCPS Pager [phone] 07/13/2018 7:05 AM

## 2018-07-13 NOTE — Progress Notes (Addendum)
   Subjective: 1 Day Post-Op Procedure(s) (LRB): Bilateral total knee arthroplasty (Bilateral) Patient reports pain as mild.   Patient seen in rounds with Dr. Lequita Halt. Patient is well, and has had no acute complaints or problems other than discomfort in the right knee. States pain is minimal in the left knee at this time. Foley catheter in place, positive flatus. Denies chest pain, SOB, or calf pain. We will start therapy today.  Plan is to go to Inpatient Rehabiliation after hospital stay.  Objective: Vital signs in last 24 hours: Temp:  [97.6 F (36.4 C)-98.4 F (36.9 C)] 98.3 F (36.8 C) (10/31 0510) Pulse Rate:  [66-84] 75 (10/31 0510) Resp:  [14-21] 16 (10/31 0705) BP: (92-149)/(55-94) 115/69 (10/31 0510) SpO2:  [92 %-100 %] 92 % (10/31 0705) Weight:  [126.1 kg] 126.1 kg (10/30 1421)  Intake/Output from previous day:  Intake/Output Summary (Last 24 hours) at 07/13/2018 0748 Last data filed at 07/13/2018 0600 Gross per 24 hour  Intake 5643.44 ml  Output 3660 ml  Net 1983.44 ml    Intake/Output this shift: No intake/output data recorded.  Labs: Recent Labs    07/13/18 0505  HGB 11.4*   Recent Labs    07/13/18 0505  WBC 12.6*  RBC 3.73*  HCT 34.5*  PLT 194   Recent Labs    07/13/18 0505  NA 139  K 4.1  CL 110  CO2 25  BUN 13  CREATININE 0.85  GLUCOSE 160*  CALCIUM 8.6*   Recent Labs    07/13/18 0505  INR 0.97    EXAM General - Patient is Alert and Oriented Extremity - Neurologically intact Neurovascular intact Sensation intact distally Dorsiflexion/Plantar flexion intact Dressing - dressing C/D/I Motor Function - intact, moving feet and toes well on exam.  Both Hemovacs pulled without difficulty.  Past Medical History:  Diagnosis Date  . Hemorrhoid 11/2016  . Hyperlipidemia   . Low back pain 02/22/2013  . OA (osteoarthritis)   . Syncope 06/16/2018    Assessment/Plan: 1 Day Post-Op Procedure(s) (LRB): Bilateral total knee  arthroplasty (Bilateral) Principal Problem:   OA (osteoarthritis) of knee  Estimated body mass index is 32.97 kg/m as calculated from the following:   Height as of this encounter: 6\' 5"  (1.956 m).   Weight as of this encounter: 126.1 kg. Advance diet Up with therapy  DVT Prophylaxis - Coumadin and Xarelto, xarelto will not begin until Saturday AM following removal of epidural on Friday. Weight-Bearing as tolerated to both legs  Continue O2 and Pulse OX   Continue foley for now.  Will keep foley until tomorrow and will not be removed until at least 6-8 hours following the removal of the epidural catheter.  Arther Abbott, PA-C Orthopaedic Surgery 07/13/2018, 7:48 AM

## 2018-07-13 NOTE — Progress Notes (Signed)
Physical Therapy Treatment Patient Details Name: Franklin Lucas MRN: [id number] DOB: Jan 20, 1956 Today's Date: 07/13/2018    History of Present Illness s/p bil TKA.  H/O syncope    PT Comments    Pt able to progress to standing assist of 2 - continues to c/o mild dizziness but BP remains stable throughout session at ~ 106/67.   Follow Up Recommendations  CIR     Equipment Recommendations  None recommended by PT    Recommendations for Other Services       Precautions / Restrictions Precautions Precautions: Fall;Knee Required Braces or Orthoses: Knee Immobilizer - Right;Knee Immobilizer - Left Knee Immobilizer - Right: Discontinue once straight leg raise with < 10 degree lag Knee Immobilizer - Left: Discontinue once straight leg raise with < 10 degree lag Restrictions Weight Bearing Restrictions: No Other Position/Activity Restrictions: WBAT    Mobility  Bed Mobility Overal bed mobility: Needs Assistance Bed Mobility: Supine to Sit;Sit to Supine     Supine to sit: +2 for physical assistance;+2 for safety/equipment;Mod assist Sit to supine: Mod assist;+2 for physical assistance;+2 for safety/equipment   General bed mobility comments: cues for sequence and assist for LE management and trunk control.  INcreased assist back to bed 2* pt dizziness  Transfers Overall transfer level: Needs assistance   Transfers: Sit to/from Stand Sit to Stand: Mod assist;+2 physical assistance;+2 safety/equipment;From elevated surface         General transfer comment: Stood ~ 1 min with RW and assist of 2  Ambulation/Gait         Gait velocity: decr   General Gait Details: Stood only; stepping not attempted 2* numbness/weakness L LE   Stairs             Wheelchair Mobility    Modified Rankin (Stroke Patients Only)       Balance Overall balance assessment: Needs assistance Sitting-balance support: Bilateral upper extremity supported;Feet supported Sitting  balance-Leahy Scale: Fair                                      Cognition Arousal/Alertness: Awake/alert Behavior During Therapy: WFL for tasks assessed/performed Overall Cognitive Status: Within Functional Limits for tasks assessed                                        Exercises Total Joint Exercises Ankle Circles/Pumps: AROM;Both;15 reps;Supine Quad Sets: AROM;AAROM;Both;10 reps;Supine Heel Slides: AAROM;Both;15 reps;Supine Straight Leg Raises: AAROM;Both;10 reps;Supine    General Comments        Pertinent Vitals/Pain Pain Assessment: 0-10 Pain Score: 2  Faces Pain Scale: Hurts a little bit Pain Location: bil knee Pain Descriptors / Indicators: Sore Pain Intervention(s): Limited activity within patient's tolerance;Monitored during session;Premedicated before session    Home Living Family/patient expects to be discharged to:: Inpatient rehab Living Arrangements: Spouse/significant other             Additional Comments: has walk in shower and standard commode    Prior Function Level of Independence: Independent          PT Goals (current goals can now be found in the care plan section) Acute Rehab PT Goals Patient Stated Goal: CIR and home PT Goal Formulation: With patient Time For Goal Achievement: 07/20/18 Potential to Achieve Goals: Good Progress towards PT goals: Progressing toward goals  Frequency    7X/week      PT Plan Current plan remains appropriate    Co-evaluation              AM-PAC PT "6 Clicks" Daily Activity  Outcome Measure  Difficulty turning over in bed (including adjusting bedclothes, sheets and blankets)?: Unable Difficulty moving from lying on back to sitting on the side of the bed? : Unable Difficulty sitting down on and standing up from a chair with arms (e.g., wheelchair, bedside commode, etc,.)?: Unable Help needed moving to and from a bed to chair (including a wheelchair)?:  Total Help needed walking in hospital room?: Total Help needed climbing 3-5 steps with a railing? : Total 6 Click Score: 6    End of Session Equipment Utilized During Treatment: Right knee immobilizer;Left knee immobilizer;Gait belt Activity Tolerance: Patient tolerated treatment well;Patient limited by fatigue Patient left: in bed;with call bell/phone within reach;with family/visitor present;with nursing/sitter in room Nurse Communication: Mobility status PT Visit Diagnosis: Muscle weakness (generalized) (M62.81);Difficulty in walking, not elsewhere classified (R26.2);Pain Pain - Right/Left: Left Pain - part of body: Knee     Time: 1610-9604 PT Time Calculation (min) (ACUTE ONLY): 28 min  Charges:  $Therapeutic Exercise: 8-22 mins $Therapeutic Activity: 23-37 mins                     Mauro Kaufmann PT Acute Rehabilitation Services Pager [phone] Office [phone]    Marco Adelson 07/13/2018, 3:36 PM

## 2018-07-13 NOTE — Evaluation (Signed)
Physical Therapy Evaluation Patient Details Name: Franklin Lucas MRN: [id number] DOB: October 07, 1955 Today's Date: 07/13/2018   History of Present Illness  s/p bil TKA.  H/O syncope  Clinical Impression  Pt s/p bil TKR and presents with decreased bil LE strength/ROM, post op pain, and dizziness with attempts to mobilize limiting functional mobility.  Pt would greatly benefit from follow up rehab at CIR to maximize IND and safety prior to return home with limited assist.    Follow Up Recommendations CIR    Equipment Recommendations  None recommended by PT    Recommendations for Other Services       Precautions / Restrictions Precautions Precautions: Fall;Knee Required Braces or Orthoses: Knee Immobilizer - Right;Knee Immobilizer - Left Knee Immobilizer - Right: Discontinue once straight leg raise with < 10 degree lag Knee Immobilizer - Left: Discontinue once straight leg raise with < 10 degree lag Restrictions Weight Bearing Restrictions: No Other Position/Activity Restrictions: WBAT      Mobility  Bed Mobility Overal bed mobility: Needs Assistance Bed Mobility: Supine to Sit;Sit to Supine     Supine to sit: +2 for physical assistance;+2 for safety/equipment;Mod assist Sit to supine: Max assist;+2 for physical assistance;+2 for safety/equipment   General bed mobility comments: cues for sequence and assist for LE management and trunk control.  INcreased assist back to bed 2* pt dizziness  Transfers                 General transfer comment: unable 2* dizziness in sitting  Ambulation/Gait                Stairs            Wheelchair Mobility    Modified Rankin (Stroke Patients Only)       Balance Overall balance assessment: Needs assistance Sitting-balance support: Bilateral upper extremity supported;Feet supported Sitting balance-Leahy Scale: Fair                                       Pertinent Vitals/Pain Pain Assessment:  Faces Pain Score: 2  Faces Pain Scale: Hurts a little bit Pain Location: bil knee Pain Descriptors / Indicators: Sore Pain Intervention(s): Limited activity within patient's tolerance;Monitored during session;Premedicated before session;Ice applied    Home Living Family/patient expects to be discharged to:: Inpatient rehab Living Arrangements: Spouse/significant other               Additional Comments: has walk in shower and standard commode    Prior Function Level of Independence: Independent               Hand Dominance        Extremity/Trunk Assessment   Upper Extremity Assessment Upper Extremity Assessment: Overall WFL for tasks assessed    Lower Extremity Assessment Lower Extremity Assessment: RLE deficits/detail;LLE deficits/detail RLE Deficits / Details: 2/5 quads with AAROM at knee -10 - 90 LLE Deficits / Details: trace quads with AAROM at knee -10-  90    Cervical / Trunk Assessment Cervical / Trunk Assessment: Normal  Communication   Communication: No difficulties  Cognition Arousal/Alertness: Awake/alert Behavior During Therapy: WFL for tasks assessed/performed Overall Cognitive Status: Within Functional Limits for tasks assessed  General Comments      Exercises Total Joint Exercises Ankle Circles/Pumps: AROM;Both;15 reps;Supine Quad Sets: AROM;AAROM;Both;10 reps;Supine Heel Slides: AAROM;Both;15 reps;Supine Straight Leg Raises: AAROM;Both;10 reps;Supine   Assessment/Plan    PT Assessment Patient needs continued PT services  PT Problem List Decreased strength;Decreased range of motion;Decreased activity tolerance;Decreased balance;Decreased mobility;Decreased knowledge of use of DME;Pain       PT Treatment Interventions DME instruction;Gait training;Stair training;Functional mobility training;Therapeutic activities;Therapeutic exercise;Balance training;Patient/family education     PT Goals (Current goals can be found in the Care Plan section)  Acute Rehab PT Goals Patient Stated Goal: CIR and home PT Goal Formulation: With patient Time For Goal Achievement: 07/20/18 Potential to Achieve Goals: Good    Frequency 7X/week   Barriers to discharge        Co-evaluation               AM-PAC PT "6 Clicks" Daily Activity  Outcome Measure Difficulty turning over in bed (including adjusting bedclothes, sheets and blankets)?: Unable Difficulty moving from lying on back to sitting on the side of the bed? : Unable Difficulty sitting down on and standing up from a chair with arms (e.g., wheelchair, bedside commode, etc,.)?: Unable Help needed moving to and from a bed to chair (including a wheelchair)?: Total Help needed walking in hospital room?: Total Help needed climbing 3-5 steps with a railing? : Total 6 Click Score: 6    End of Session Equipment Utilized During Treatment: Right knee immobilizer;Left knee immobilizer;Gait belt Activity Tolerance: Patient tolerated treatment well;Patient limited by fatigue Patient left: in bed;with call bell/phone within reach;with family/visitor present;with nursing/sitter in room Nurse Communication: Mobility status PT Visit Diagnosis: Muscle weakness (generalized) (M62.81);Difficulty in walking, not elsewhere classified (R26.2);Pain Pain - Right/Left: Right Pain - part of body: Knee    Time: 1142-1225 PT Time Calculation (min) (ACUTE ONLY): 43 min   Charges:   PT Evaluation $PT Eval Low Complexity: 1 Low PT Treatments $Therapeutic Exercise: 8-22 mins        Franklin Lucas PT Acute Rehabilitation Services Pager [phone] Office [phone]   Franklin Lucas 07/13/2018, 1:51 PM

## 2018-07-13 NOTE — Evaluation (Signed)
Occupational Therapy Evaluation Patient Details Name: Pradyun Ishman MRN: [id number] DOB: 12-23-1955 Today's Date: 07/13/2018    History of Present Illness s/p bil TKA.  H/O syncope   Clinical Impression   This 62 year old man was admitted for the above sxs. At baseline, he is independent with all adls.  He was limited by dizziness today when he sat at EOB. Will continue to follow with the goals listed below    Follow Up Recommendations  CIR    Equipment Recommendations  3 in 1 bedside commode    Recommendations for Other Services       Precautions / Restrictions Precautions Precautions: Fall;Knee Required Braces or Orthoses: Knee Immobilizer - Right;Knee Immobilizer - Left Restrictions Weight Bearing Restrictions: No      Mobility Bed Mobility Overal bed mobility: Needs Assistance Bed Mobility: Supine to Sit     Supine to sit: +2 for physical assistance;+2 for safety/equipment;Mod assist Sit to supine: Max assist;+2 for physical assistance;+2 for safety/equipment      Transfers                 General transfer comment: unable    Balance                                           ADL either performed or assessed with clinical judgement   ADL Overall ADL's : Needs assistance/impaired Eating/Feeding: Independent   Grooming: Set up   Upper Body Bathing: Set up   Lower Body Bathing: Maximal assistance   Upper Body Dressing : Set up   Lower Body Dressing: Total assistance                 General ADL Comments: adls assessed from bed level:  he was only able to tolerate sitting EOB for about a minute then dizziness felt worse.  Dynamap used after returning to supine 126/64. Educated on AE     Vision         Perception     Praxis      Pertinent Vitals/Pain Pain Assessment: Faces Faces Pain Scale: Hurts a little bit Pain Location: bil knee Pain Descriptors / Indicators: Sore Pain Intervention(s): Limited activity  within patient's tolerance;Monitored during session;Premedicated before session;Repositioned;Ice applied     Hand Dominance     Extremity/Trunk Assessment Upper Extremity Assessment Upper Extremity Assessment: Overall WFL for tasks assessed           Communication Communication Communication: No difficulties   Cognition Arousal/Alertness: Awake/alert Behavior During Therapy: WFL for tasks assessed/performed Overall Cognitive Status: Within Functional Limits for tasks assessed                                     General Comments       Exercises     Shoulder Instructions      Home Living Family/patient expects to be discharged to:: Inpatient rehab Living Arrangements: Spouse/significant other                               Additional Comments: has walk in shower and standard commode      Prior Functioning/Environment Level of Independence: Independent                 OT  Problem List: Decreased strength;Decreased activity tolerance;Cardiopulmonary status limiting activity;Decreased knowledge of precautions;Decreased knowledge of use of DME or AE;Pain      OT Treatment/Interventions: Self-care/ADL training;DME and/or AE instruction;Patient/family education;Therapeutic activities    OT Goals(Current goals can be found in the care plan section) Acute Rehab OT Goals OT Goal Formulation: With patient/family Time For Goal Achievement: 07/27/18 Potential to Achieve Goals: Good ADL Goals Pt Will Perform Grooming: with min guard assist;standing Pt Will Perform Lower Body Bathing: with min assist;sit to/from stand;with adaptive equipment Pt Will Transfer to Toilet: with min assist;ambulating;bedside commode Pt Will Perform Toileting - Clothing Manipulation and hygiene: with min assist;sit to/from stand Additional ADL Goal #1: pt will perform bed mobility with min guard assist using leg lifters in preparation for adls  OT Frequency: Min  2X/week   Barriers to D/C:            Co-evaluation              AM-PAC PT "6 Clicks" Daily Activity     Outcome Measure Help from another person eating meals?: None Help from another person taking care of personal grooming?: A Little Help from another person toileting, which includes using toliet, bedpan, or urinal?: A Lot Help from another person bathing (including washing, rinsing, drying)?: A Lot Help from another person to put on and taking off regular upper body clothing?: A Little Help from another person to put on and taking off regular lower body clothing?: Total 6 Click Score: 15   End of Session    Activity Tolerance: Treatment limited secondary to medical complications (Comment)(dizziness) Patient left: in bed;with call bell/phone within reach;with family/visitor present  OT Visit Diagnosis: Pain Pain - Right/Left: (bil) Pain - part of body: Knee                Time:1155- 1207   Charges:  OT General Charges $OT Visit: 1 Visit OT Evaluation $OT Eval Low Complexity: 1 Low  Marica Otter, OTR/L Acute Rehabilitation Services [phone] [facility] pager [phone] office 07/13/2018  Brigette Hopfer 07/13/2018, 12:57 PM

## 2018-07-13 NOTE — Progress Notes (Signed)
Inpatient Rehabilitation-Admissions Coordinator   [facility] acknowledging IP Rehab Consult Order Placed by Ortho. At this time, [facility] will await therapy evaluations to help in determining most appropriate post acute rehab venue.   Please call if questions.   Nanine Means, OTR/L  Rehab Admissions Coordinator  [phone] 07/13/2018 9:23 AM

## 2018-07-14 ENCOUNTER — Encounter (HOSPITAL_COMMUNITY): Payer: Self-pay | Admitting: Orthopedic Surgery

## 2018-07-14 LAB — CBC
HEMATOCRIT: 32.5 % — AB (ref 39.0–52.0)
HEMOGLOBIN: 10.3 g/dL — AB (ref 13.0–17.0)
MCH: 29.9 pg (ref 26.0–34.0)
MCHC: 31.7 g/dL (ref 30.0–36.0)
MCV: 94.5 fL (ref 80.0–100.0)
NRBC: 0 % (ref 0.0–0.2)
Platelets: 185 10*3/uL (ref 150–400)
RBC: 3.44 MIL/uL — ABNORMAL LOW (ref 4.22–5.81)
RDW: 12.1 % (ref 11.5–15.5)
WBC: 12.7 10*3/uL — AB (ref 4.0–10.5)

## 2018-07-14 LAB — BASIC METABOLIC PANEL
ANION GAP: 4 — AB (ref 5–15)
BUN: 12 mg/dL (ref 6–20)
CALCIUM: 8.3 mg/dL — AB (ref 8.9–10.3)
CHLORIDE: 108 mmol/L (ref 98–111)
CO2: 28 mmol/L (ref 22–32)
CREATININE: 0.77 mg/dL (ref 0.61–1.24)
GFR calc non Af Amer: >60 mL/min (ref >60)
Glucose, Bld: 112 mg/dL — ABNORMAL HIGH (ref 70–99)
POTASSIUM: 3.9 mmol/L (ref 3.5–5.1)
Sodium: 140 mmol/L (ref 135–145)

## 2018-07-14 LAB — PROTIME-INR
INR: 0.97
PROTHROMBIN TIME: 12.8 s (ref 11.4–15.2)

## 2018-07-14 MED ORDER — WARFARIN SODIUM 2.5 MG PO TABS
2.5000 mg | ORAL_TABLET | Freq: Once | ORAL | Status: AC
  Administered 2018-07-14: 2.5 mg via ORAL
  Filled 2018-07-14: qty 1

## 2018-07-14 MED ORDER — KETOROLAC TROMETHAMINE 30 MG/ML IJ SOLN
30.0000 mg | Freq: Four times a day (QID) | INTRAMUSCULAR | Status: AC
  Administered 2018-07-14 – 2018-07-15 (×2): 30 mg via INTRAVENOUS
  Filled 2018-07-14 (×2): qty 1

## 2018-07-14 MED ORDER — RIVAROXABAN 10 MG PO TABS: 10.0000 mg | ORAL_TABLET | Freq: Every day | ORAL | Status: DC

## 2018-07-14 MED ORDER — RIVAROXABAN 10 MG PO TABS: 10.0000 mg | ORAL_TABLET | Freq: Every day | ORAL | 0 refills | Status: DC

## 2018-07-14 MED ORDER — SODIUM CHLORIDE 0.9 % IV SOLN
25.0000 mg | Freq: Four times a day (QID) | INTRAVENOUS | Status: DC | PRN
  Administered 2018-07-15: 25 mg via INTRAVENOUS
  Filled 2018-07-14 (×3): qty 1

## 2018-07-14 NOTE — Progress Notes (Signed)
ANTICOAGULATION CONSULT NOTE - Follow Up  Pharmacy Consult for warfarin - plan per ortho to switch to Xarelto after epidural is removed Indication: VTE prophylaxis  No Known Allergies  Patient Measurements: Height: 6\' 5"  (195.6 cm) Weight: 278 lb (126.1 kg) IBW/kg (Calculated) : 89.1  Vital Signs: Temp: 97.6 F (36.4 C) (11/01 0542) Temp Source: Oral (11/01 0542) BP: 134/72 (11/01 0542) Pulse Rate: 78 (11/01 0542)  Labs: Recent Labs    07/13/18 0505 07/14/18 0534  HGB 11.4* 10.3*  HCT 34.5* 32.5*  PLT 194 185  LABPROT 12.8 12.8  INR 0.97 0.97  CREATININE 0.85 0.77    Estimated Creatinine Clearance: 144.3 mL/min (by C-G formula based on SCr of 0.77 mg/dL).   Medical History: Past Medical History:  Diagnosis Date  . Hemorrhoid 11/2016  . Hyperlipidemia   . Low back pain 02/22/2013  . OA (osteoarthritis)   . Syncope 06/16/2018    Medications:  Scheduled:  . docusate sodium  100 mg Oral BID  . [START ON 07/15/2018] rivaroxaban  10 mg Oral Daily  . Warfarin - Pharmacist Dosing Inpatient   Does not apply q1800   Infusions:  . sodium chloride 100 mL/hr at 07/14/18 0700  . methocarbamol (ROBAXIN) IV Stopped (07/12/18 1430)  . ropivacaine (PF) 2 mg/mL (0.2%) 10 mL/hr (07/14/18 0700)    Assessment: 62 yo s/p Bilateral TKA currently on epidural post op to start per ortho Md orders warfarin this AM at 0900. Baseline CBC and INR good.   Today, 07/14/18  INR subtherapeutic as expected after first dose of warfarin 2.5mg   CBC stable  No reported bleeding  Goal of Therapy:  INR 2-3 Monitor platelets by anticoagulation protocol: Yes   Plan:  1) With epidural still in place, will repeat warfarin 2.5mg  this PM 2) Daily INR   Hessie Knows, PharmD, BCPS Pager [phone] 07/14/2018 10:08 AM

## 2018-07-14 NOTE — Progress Notes (Signed)
Physical Therapy Treatment Patient Details Name: Franklin Lucas MRN: [id number] DOB: 11-Mar-1956 Today's Date: 07/14/2018    History of Present Illness s/p bil TKA.  H/O syncope    PT Comments    Therex program performed but OOB deferred to after removal of epidural 2* L LE numbness   Follow Up Recommendations  CIR     Equipment Recommendations  None recommended by PT    Recommendations for Other Services       Precautions / Restrictions Precautions Precautions: Fall;Knee Required Braces or Orthoses: Knee Immobilizer - Right;Knee Immobilizer - Left Knee Immobilizer - Right: Discontinue once straight leg raise with < 10 degree lag Knee Immobilizer - Left: Discontinue once straight leg raise with < 10 degree lag Restrictions Weight Bearing Restrictions: No Other Position/Activity Restrictions: WBAT    Mobility  Bed Mobility               General bed mobility comments: deferred to after epidural removed  Transfers                    Ambulation/Gait                 Stairs             Wheelchair Mobility    Modified Rankin (Stroke Patients Only)       Balance                                            Cognition Arousal/Alertness: Awake/alert Behavior During Therapy: WFL for tasks assessed/performed Overall Cognitive Status: Within Functional Limits for tasks assessed                                        Exercises Total Joint Exercises Ankle Circles/Pumps: AROM;Both;15 reps;Supine Quad Sets: AROM;AAROM;Both;10 reps;Supine Heel Slides: AAROM;Both;15 reps;Supine Straight Leg Raises: AAROM;Both;10 reps;Supine Goniometric ROM: bilat knees -10 - 90    General Comments        Pertinent Vitals/Pain Pain Assessment: 0-10 Pain Score: 5  Pain Location: R knee 5/10; no pain L knee Pain Descriptors / Indicators: Sore Pain Intervention(s): Limited activity within patient's tolerance;Monitored  during session;Premedicated before session;Ice applied    Home Living                      Prior Function            PT Goals (current goals can now be found in the care plan section) Acute Rehab PT Goals Patient Stated Goal: CIR and home PT Goal Formulation: With patient Time For Goal Achievement: 07/20/18 Potential to Achieve Goals: Good Progress towards PT goals: Progressing toward goals    Frequency    7X/week      PT Plan Current plan remains appropriate    Co-evaluation              AM-PAC PT "6 Clicks" Daily Activity  Outcome Measure  Difficulty turning over in bed (including adjusting bedclothes, sheets and blankets)?: Unable Difficulty moving from lying on back to sitting on the side of the bed? : Unable Difficulty sitting down on and standing up from a chair with arms (e.g., wheelchair, bedside commode, etc,.)?: Unable Help needed moving to and from a bed to chair (including a wheelchair)?:  Total Help needed walking in hospital room?: Total Help needed climbing 3-5 steps with a railing? : Total 6 Click Score: 6    End of Session         PT Visit Diagnosis: Muscle weakness (generalized) (M62.81);Difficulty in walking, not elsewhere classified (R26.2);Pain Pain - Right/Left: Right Pain - part of body: Knee     Time: 3244-0102 PT Time Calculation (min) (ACUTE ONLY): 26 min  Charges:  $Therapeutic Exercise: 23-37 mins                     Franklin Lucas PT Acute Rehabilitation Services Pager [phone] Office [phone]    Franklin Lucas 07/14/2018, 10:22 AM

## 2018-07-14 NOTE — Progress Notes (Signed)
Inpatient Rehabilitation-Admissions Coordinator   Spoke with pt over the phone regarding CIR program. Pt provided with information on average length of stay, program details, and expectations. Pt very interested in CIR at this time; he does report some hesitancy regarding support at DC as his wife is currently having her own medical issues as well. AC did speak with his wife over the phone and she confirmed she would be able to provide Supervision/Min A for her husband at discharge. AC explained to both pt and his wife that I will monitor his progress with therapies over the weekend and see how he tolerates therapies once the epidural has been removed. Both verbalized understanding.   AC has began insurance authorization process for possible admit. Will follow up with pt on Monday.   Please call if questions.   Nanine Means, OTR/L  Rehab Admissions Coordinator  [phone] 07/14/2018 10:19 AM

## 2018-07-14 NOTE — Progress Notes (Signed)
Epidural removed at 5:00pm by anesthesiologist. Discarded with Richrd Sox. Gave patient oxycodone 10mg  PO and robaxin, patient stated pain was 6 at 18:27. John, ortho tech, placed CPM on right leg, at [employer], patient screamed, writhing in pain, holding onto the bedrails, and sweating. Patient asked for IV dose. Pain scale was a 10. Will continue to monitor.

## 2018-07-14 NOTE — Anesthesia Post-op Follow-up Note (Signed)
  Anesthesia Pain Follow-up Note  Patient: Franklin Lucas  Day #: 2  Date of Follow-up: 07/14/2018 Time: 4:50 PM  Last Vitals:  Vitals:   07/14/18 0542 07/14/18 1458  BP: 134/72 139/63  Pulse: 78 86  Resp: 20 19  Temp: 36.4 C 36.8 C  SpO2: 98% 99%    Level of Consciousness: alert  Pain: none   Side Effects:None  Catheter Site Exam:clean, dry, no drainage  Anti-Coag Meds (From admission, onward)   Start     Dose/Rate Route Frequency Ordered Stop   07/15/18 0000  rivaroxaban (XARELTO) 10 MG TABS tablet     10 mg Oral Daily 07/14/18 0759 08/05/18 2359   07/14/18 1800  warfarin (COUMADIN) tablet 2.5 mg     2.5 mg Oral One time only - 1800 07/14/18 1012 07/15/18 1759   07/13/18 0900  Warfarin - Pharmacist Dosing Inpatient      Does not apply Daily-1800 07/13/18 0706      Epidural / Intrathecal (From admission, onward)   Start     Dose/Rate Route Frequency Ordered Stop   07/13/18 1430  ropivacaine (PF) 2 mg/mL (0.2%) (NAROPIN) injection     10 mL/hr 10 mL/hr  Epidural Continuous 07/13/18 1429         Plan: Catheter removed/tip intact at surgeon's request, D/C Infusion at surgeon's request and D/C from anesthesia care at surgeon's request  Caralynn Gelber L Aleksandra Raben

## 2018-07-14 NOTE — Progress Notes (Signed)
   Subjective: 2 Days Post-Op Procedure(s) (LRB): Bilateral total knee arthroplasty (Bilateral) Patient reports pain as mild.   Patient seen in rounds for Dr. Lequita Halt. Patient is well, and has had no acute complaints or problems other than discomfort in the right knee, left knee still with no pain. Foley catheter in place, positive flatus. Denies chest pain, SOB, or calf pain. Epidural to come out today.  Anticipate possible increase in pain temporarily after the epidural has been removed.  Objective: Vital signs in last 24 hours: Temp:  [97.6 F (36.4 C)-98.7 F (37.1 C)] 97.6 F (36.4 C) (11/01 0542) Pulse Rate:  [72-81] 78 (11/01 0542) Resp:  [18-20] 20 (11/01 0542) BP: (113-134)/(62-77) 134/72 (11/01 0542) SpO2:  [98 %-99 %] 98 % (11/01 0542)  Intake/Output from previous day:  Intake/Output Summary (Last 24 hours) at 07/14/2018 0752 Last data filed at 07/14/2018 0550 Gross per 24 hour  Intake 2792.84 ml  Output 5050 ml  Net -2257.16 ml    Labs: Recent Labs    07/13/18 0505 07/14/18 0534  HGB 11.4* 10.3*   Recent Labs    07/13/18 0505 07/14/18 0534  WBC 12.6* 12.7*  RBC 3.73* 3.44*  HCT 34.5* 32.5*  PLT 194 185   Recent Labs    07/13/18 0505 07/14/18 0534  NA 139 140  K 4.1 3.9  CL 110 108  BUN 13 12  CREATININE 0.85 0.77  GLUCOSE 160* 112*  CALCIUM 8.6* 8.3*   Recent Labs    07/13/18 0505 07/14/18 0534  INR 0.97 0.97    EXAM General - Patient is Alert and Oriented Extremity - Neurologically intact Neurovascular intact Sensation intact distally Dorsiflexion/Plantar flexion intact Dressing/Incision - clean, dry, with minimal bloody drainage from hemovac port site on left knee. Motor Function - intact, moving feet and toes well on exam.    Past Medical History:  Diagnosis Date  . Hemorrhoid 11/2016  . Hyperlipidemia   . Low back pain 02/22/2013  . OA (osteoarthritis)   . Syncope 06/16/2018    Assessment/Plan: 2 Days Post-Op  Procedure(s) (LRB): Bilateral total knee arthroplasty (Bilateral) Principal Problem:   OA (osteoarthritis) of knee  Estimated body mass index is 32.97 kg/m as calculated from the following:   Height as of this encounter: 6\' 5"  (1.956 m).   Weight as of this encounter: 126.1 kg. Up with therapy Continue foley. Will remove the catheter six hours after the epidural is removed.  Will need to note in chart when the epidural is pulled.  DVT Prophylaxis - Coumadin and Xarelto. 10 mg xarelto QD will begin tomorrow AM, will continue with coumadin dose today due to epidural. Pt will take xarelto for three weeks following surgery, then will begin 81 mg aspirin once a day for three weeks, then may discontinue aspirin.  Weight-Bearing as tolerated to both legs  Disposition: Patient would benefit from discharge to [facility] for continued assistance with ambulation and therapy.  Arther Abbott, PA-C Orthopaedic Surgery 07/14/2018, 7:52 AM

## 2018-07-14 NOTE — PMR Pre-admission (Signed)
[facility] PMR Admission Coordinator Pre-Admission Assessment  Patient: Franklin Lucas is an 62 y.o., male MRN: [id number] DOB: [birth year] Height: _0  (195.6 cm) Weight: 126.1 kg  Insurance Information HMO:     PPO: Yes     PCP:      IPA:      80/20:      OTHER:  PRIMARY: [employer] [facility] Choice Plus COBRA)      Policy#: [id number]      Subscriber: patient CM Name: Sharee Pimple      Phone#: [phone]     Fax#:  Pre-Cert#: [id number]      Employer:  [employer]auf[employer] provided by [employer] at [facility] for admit date 07/17/18 with 7 days approved (clinical updates due 07/23/18) Benefits:  Phone #: NA     Name: [email or web address] Eff. Date: 03/13/18 (terminates currently on 08/12/18)     Deduct: individual-none; Family:$3,000 (met:$3,000)     Out of Pocket Max: Individual: none; Family: $6,000 (met: $6,000) includes deductible      Life Max: NA CIR: 80%/20%      SNF: 80%/20%; 60 day limit Outpatient: 80%; 20 PT, 20 OT, 20 ST    Co-Pay: 20% Home Health: 80%; 60 visit limit     Co-Pay: 20% DME: 80%     Co-Pay: 20% Providers:  SECONDARY:       Policy#:       Subscriber:  CM Name:       Phone#:      Fax#:  Pre-Cert#:       Employer:  Benefits:  Phone #:      Name:  Eff. Date:      Deduct:       Out of Pocket Max:       Life Max:  CIR:       SNF:  Outpatient:      Co-Pay:  Home Health:       Co-Pay:  DME:      Co-Pay:   Medicaid Application Date:       Case Manager:  Disability Application Date:       Case Worker:   Emergency Contact Information Contact Information    Name Relation Home Work Mobile   Gwilliam,[name] Spouse [phone]  [phone]      Current Medical History  Patient Admitting Diagnosis: Osteoarthritis of bilateral knees, now s/p bilateral total knee arthroplasty History of Present Illness: Pt is a 62 yo M with past medical history of carpal tunnel, syncope, and right knee surgery. Pt has had onset of pain and functional disability in bilateral knees for with onset starting approx 10 years ago with  progressive worsening. He has undergone non-surgical conservative treatments for more than 12 weeks with no improvement.  Pt presented for bilateral total knee replacements on 07/12/18 due to severe degenerative joint disease of bilateral knees. Pt tolerated PT and OT evaluations with some dizziness reported during change in positions. Pt has progressed well with therapies and CIR has been recommended. Pt is to be admitted to CIR on 07/17/18.   Patient's medical record from [facility] has been reviewed by the rehabilitation admission coordinator and physician. )    Past Medical History  Past Medical History:  Diagnosis Date  . Hemorrhoid 11/2016  . Hyperlipidemia   . Low back pain 02/22/2013  . OA (osteoarthritis)   . Syncope 06/16/2018    Family History   family history includes Alcohol abuse in his paternal grandfather; Cancer in his mother; Emphysema in his maternal grandfather; Stroke in  his paternal grandfather and sister.  Prior Rehab/Hospitalizations Has the patient had major surgery during 100 days prior to admission? No    Current Medications  Current Facility-Administered Medications:  .  0.9 %  sodium chloride infusion, , Intravenous, Continuous, Gaynelle Arabian, MD, Stopped at 07/15/18 1729 .  bisacodyl (DULCOLAX) suppository 10 mg, 10 mg, Rectal, Daily PRN, Aluisio, Pilar Plate, MD .  chlorproMAZINE (THORAZINE) 25 mg in sodium chloride 0.9 % 25 mL IVPB, 25 mg, Intravenous, Q6H PRN, Nicholes Stairs, MD, Stopped at 07/15/18 1523 .  diphenhydrAMINE (BENADRYL) 12.5 MG/5ML elixir 12.5-25 mg, 12.5-25 mg, Oral, Q4H PRN, Gaynelle Arabian, MD, 25 mg at 07/13/18 0240 .  docusate sodium (COLACE) capsule 100 mg, 100 mg, Oral, BID, Aluisio, Frank, MD, 100 mg at 07/17/18 0953 .  hydrocortisone cream 1 %, , Topical, BID, Babish, Matthew, PA-C .  HYDROmorphone (DILAUDID) injection 0.5-1 mg, 0.5-1 mg, Intravenous, Q4H PRN, Gaynelle Arabian, MD, 1 mg at 07/16/18 1115 .   menthol-cetylpyridinium (CEPACOL) lozenge 3 mg, 1 lozenge, Oral, PRN **OR** phenol (CHLORASEPTIC) mouth spray 1 spray, 1 spray, Mouth/Throat, PRN, Aluisio, Frank, MD .  methocarbamol (ROBAXIN) tablet 500 mg, 500 mg, Oral, Q6H PRN, 500 mg at 07/17/18 0618 **OR** methocarbamol (ROBAXIN) 500 mg in dextrose 5 % 50 mL IVPB, 500 mg, Intravenous, Q6H PRN, Gaynelle Arabian, MD, Stopped at 07/12/18 1430 .  metoCLOPramide (REGLAN) tablet 5-10 mg, 5-10 mg, Oral, Q8H PRN **OR** metoCLOPramide (REGLAN) injection 5-10 mg, 5-10 mg, Intravenous, Q8H PRN, Aluisio, Frank, MD .  ondansetron (ZOFRAN) tablet 4 mg, 4 mg, Oral, Q6H PRN, 4 mg at 07/17/18 1231 **OR** ondansetron (ZOFRAN) injection 4 mg, 4 mg, Intravenous, Q6H PRN, Aluisio, Frank, MD .  oxyCODONE (Oxy IR/ROXICODONE) immediate release tablet 10-15 mg, 10-15 mg, Oral, Q4H PRN, Gaynelle Arabian, MD, 15 mg at 07/17/18 1407 .  oxyCODONE (Oxy IR/ROXICODONE) immediate release tablet 5-10 mg, 5-10 mg, Oral, Q4H PRN, Gaynelle Arabian, MD, 10 mg at 07/16/18 0524 .  polyethylene glycol (MIRALAX / GLYCOLAX) packet 17 g, 17 g, Oral, Daily PRN, Gaynelle Arabian, MD, 17 g at 07/17/18 1227 .  rivaroxaban (XARELTO) tablet 10 mg, 10 mg, Oral, Daily, Aluisio, Frank, MD, 10 mg at 07/17/18 0953 .  senna-docusate (Senokot-S) tablet 1 tablet, 1 tablet, Oral, QHS, Angiulli, Lavon Paganini, PA-C .  sodium phosphate (FLEET) 7-19 GM/118ML enema 1 enema, 1 enema, Rectal, Once PRN, Aluisio, Frank, MD .  traMADol Veatrice Bourbon) tablet 50-100 mg, 50-100 mg, Oral, Q6H PRN, Gaynelle Arabian, MD, 100 mg at 07/16/18 1805  Patients Current Diet:   Diet Order            Diet - low sodium heart healthy        Diet regular Room service appropriate? Yes; Fluid consistency: Thin  Diet effective now              Precautions / Restrictions Precautions Precautions: Fall, Knee Precaution Comments: wear L KI  Restrictions Weight Bearing Restrictions: No Other Position/Activity Restrictions: WBAT   Has the  patient had 2 or more falls or a fall with injury in the past year?No  Prior Activity Level Community (5-7x/wk): retired; active; drove; was having bilatearl knee pain that limited gait quality  Prior Functional Level Do you want Prior Function Level of Independence: Independent from other? Self Care: Did the patient need help bathing, dressing, using the toilet or eating?  Independent  Indoor Mobility: Did the patient need assistance with walking from room to room (with or without device)? Independent  Stairs:  Did the patient need assistance with internal or external stairs (with or without device)? Independent  Functional Cognition: Did the patient need help planning regular tasks such as shopping or remembering to take medications? Independent  Home Assistive Devices / Equipment Home Assistive Devices/Equipment: Eyeglasses  Prior Device Use: Indicate devices/aids used by the patient prior to current illness, exacerbation or injury? None of the above  Prior Functional Level     Prior Functional Level Current Functional Level  Bed Mobility Independent Mod A  Transfers Independent Min/Mod Ax 2  Mobility - Walk/Wheelchair  Independent Min A x2   Mobility - Ambulation/Gait  Indepenent Min A x2  Upper Body Dressing Independent Set up A  Lower Body Dressing Independent Max A  Grooming Independent Set up A  Eating/Drinking Independent Independent  Toilet Transfer  Independent Not assessed  Bladder Continence  continent continent   Bowel Management   continent  continent  Stair Climbing  Independent NT   Communication No difficulties No difficulties  Memory   WNL   Cooking/Meal Prep  Independent      Housework  Independent   Money Management  Independent   Driving   Independent     Special needs/care consideration BiPAP/CPAP: no CPM: Yes Continuous Drip IV: no Dialysis: no        Days: no Life Vest: no Oxygen: no Special Bed: no Trach Size: no Wound Vac (area):  no     Location: no Skin: blister to right posterior thigh, cervical and thoracis rash on back; bilateral knee incision              Bowel mgmt:Last BM: 07/11/18 Bladder mgmt: continent Diabetic mgmt: no  Previous Home Environment Living Arrangements: Spouse/significant other Home Care Services: No Additional Comments: has walk in shower and standard commode  Discharge Living Setting Plans for Discharge Living Setting: Patient's home, Lives with (comment)(wife) Type of Home at Discharge: House Discharge Home Layout: One level Discharge Home Access: Stairs to enter Entrance Stairs-Rails: None Entrance Stairs-Number of Steps: 1 step, then 2 steps Discharge Bathroom Shower/Tub: Walk-in shower Discharge Bathroom Toilet: Handicapped height Discharge Bathroom Accessibility: Yes How Accessible: Accessible via walker Does the patient have any problems obtaining your medications?: No(family can assist in getting meds if unable to drive)  Social/Family/Support Systems Patient Roles: Spouse, Parent Contact Information: wife is emergency contact: [name] Anticipated Caregiver: Maudie Mercury and daughter karen if needed Anticipated Caregiver's Contact Information: [name]: cell [phone]) karen: ([phone]) Ability/Limitations of Caregiver: Wife: supervision/Min A; wife may need eye sx in near future; Dtr can do inconsistent assist if needed Caregiver Availability: 24/7 Discharge Plan Discussed with Primary Caregiver: Yes(with wife and pt) Is Caregiver In Agreement with Plan?: Yes Does Caregiver/Family have Issues with Lodging/Transportation while Pt is in Rehab?: No  Goals/Additional Needs Patient/Family Goal for Rehab: PT/OT: Supervision/MIn A; SLP: NA Expected length of stay: 10-14 days Cultural Considerations: [place] Dietary Needs: Regular, thin liquids  Equipment Needs: TBD Pt/Family Agrees to Admission and willing to participate: Yes Program Orientation Provided & Reviewed with Pt/Caregiver  Including Roles  & Responsibilities: Yes(reveiwed with pt and wife)  Barriers to Discharge: Decreased caregiver support, Home environment access/layout, Other (comments)  Barriers to Discharge Comments: wife may need to have eye surgery in near future (f/u appmt mid november)  Patient Condition: I have reviewed medical records from [facility], spoken with RN, CM, and patient and spouse. I met with patient at the bedside and discussed via phone for inpatient rehabilitation assessment.  Patient will  benefit from ongoing PT and OT, can actively participate in 3 hours of therapy a day 5 days of the week, and can make measurable gains during the admission.  Patient will also benefit from the coordinated team approach during an Inpatient Acute Rehabilitation admission.  The patient will receive intensive therapy as well as Rehabilitation physician, nursing, social worker, and care management interventions.  Due to bowel management, bladder management, safety, skin/wound care, disease management, medical administration, pain management, and patient education the patient requires 24 hour a day rehabilitation nursing.  The patient is currently Min/Mod A x2 with mobility and set up A for basic ADLs with Max A for LB ADLs.  Discharge setting and therapy post discharge at home with home health is anticipated.  Patient has agreed to participate in the Acute Inpatient Rehabilitation Program and will admit 07/17/18.  Preadmission Screen Completed By:  Jhonnie Garner, 07/17/2018 3:24 PM ______________________________________________________________________   Discussed status with Dr. Posey Pronto on 07/17/18 at 3:45PM and received telephone approval for admission today.  Admission Coordinator:  Jhonnie Garner, time 3:45PM/Date 07/17/18   Assessment/Plan: Diagnosis: Osteoarthritis of bilateral knees, now s/p bilateral total knee arthroplasty  1. Does the need for close, 24 hr/day  Medical supervision in concert with the  patient's rehab needs make it unreasonable for this patient to be served in a less intensive setting? Yes  2. Co-Morbidities requiring supervision/potential complications: carpal tunnel, syncope, right knee surgery 3. Due to bowel management, safety, skin/wound care, disease management, pain management and patient education, does the patient require 24 hr/day rehab nursing? Yes 4. Does the patient require coordinated care of a physician, rehab nurse, PT (1-2 hrs/day, 5 days/week) and OT (1-2 hrs/day, 5 days/week) to address physical and functional deficits in the context of the above medical diagnosis(es)? Yes Addressing deficits in the following areas: balance, endurance, locomotion, strength, transferring, bathing, dressing, toileting and psychosocial support 5. Can the patient actively participate in an intensive therapy program of at least 3 hrs of therapy 5 days a week? Yes 6. The potential for patient to make measurable gains while on inpatient rehab is excellent 7. Anticipated functional outcomes upon discharge from inpatients are: supervision PT, supervision and min assist OT, n/a SLP 8. Estimated rehab length of stay to reach the above functional goals is: 7-11 days. 9. Anticipated D/C setting: Home 10. Anticipated post D/C treatments: HH therapy and Home excercise program 11. Overall Rehab/Functional Prognosis: excellent    RECOMMENDATIONS: This patient's condition is appropriate for continued rehabilitative care in the following setting: CIR Patient has agreed to participate in recommended program. Yes Note that insurance prior authorization may be required for reimbursement for recommended care.  Comment:  Jhonnie Garner 07/17/2018  Delice Lesch, MD, ABPMR 07/17/18

## 2018-07-14 NOTE — Plan of Care (Signed)
Plan of care reviewed and discussed with patient and spouse. 

## 2018-07-14 NOTE — Discharge Summary (Signed)
Physician Discharge Summary   Patient ID: Apolo Cutshaw MRN: [id number] DOB/AGE: January 04, 1956 62 y.o.  Admit date: 07/12/2018 Discharge date: 07/17/2018  Primary Diagnosis: Osteoarthritis, bilateral knees   Admission Diagnoses:  Past Medical History:  Diagnosis Date  . Hemorrhoid 11/2016  . Hyperlipidemia   . Low back pain 02/22/2013  . OA (osteoarthritis)   . Syncope 06/16/2018   Discharge Diagnoses:   Principal Problem:   OA (osteoarthritis) of knee  Estimated body mass index is 32.97 kg/m as calculated from the following:   Height as of this encounter: _0  (1.956 m).   Weight as of this encounter: 126.1 kg.  Procedure:  Procedure(s) (LRB): Bilateral total knee arthroplasty (Bilateral)   Consults: None  HPI: Mandeep Kiser is a 62 y.o. year old male with end stage OA of both knees with progressively worsening pain and dysfunction. The patient has constant pain, with activity and at rest and significant functional deficits with difficulties even with ADLs. The patient has had extensive non-op management including analgesics, injections of cortisone and viscosupplements, and home exercise program, but remains in significant pain with significant dysfunction. We discussed replacing both knees in the same setting versus one at a time including procedure, risks, potential complications, rehab course, and pros and cons associated with each and the patient elects to do both knees at the same time. The patient presents now for bilateral Total Knee Arthroplasty.   Laboratory Data: Admission on 07/12/2018  Component Date Value Ref Range Status  . WBC 07/13/2018 12.6* 4.0 - 10.5 K/uL Final  . RBC 07/13/2018 3.73* 4.22 - 5.81 MIL/uL Final  . Hemoglobin 07/13/2018 11.4* 13.0 - 17.0 g/dL Final  . HCT 07/13/2018 34.5* 39.0 - 52.0 % Final  . MCV 07/13/2018 92.5  80.0 - 100.0 fL Final  . MCH 07/13/2018 30.6  26.0 - 34.0 pg Final  . MCHC 07/13/2018 33.0  30.0 - 36.0 g/dL Final  . RDW  07/13/2018 11.7  11.5 - 15.5 % Final  . Platelets 07/13/2018 194  150 - 400 K/uL Final  . nRBC 07/13/2018 0.0  0.0 - 0.2 % Final   Performed at [facility], [place] [address] [place], [place] [address]  . Sodium 07/13/2018 139  135 - 145 mmol/L Final  . Potassium 07/13/2018 4.1  3.5 - 5.1 mmol/L Final  . Chloride 07/13/2018 110  98 - 111 mmol/L Final  . CO2 07/13/2018 25  22 - 32 mmol/L Final  . Glucose, Bld 07/13/2018 160* 70 - 99 mg/dL Final  . BUN 07/13/2018 13  6 - 20 mg/dL Final  . Creatinine, Ser 07/13/2018 0.85  0.61 - 1.24 mg/dL Final  . Calcium 07/13/2018 8.6* 8.9 - 10.3 mg/dL Final  . GFR calc non Af Amer 07/13/2018 >60  >60 mL/min Final  . GFR calc Af Amer 07/13/2018 >60  >60 mL/min Final   Comment: (NOTE) The eGFR has been calculated using the CKD EPI equation. This calculation has not been validated in all clinical situations. eGFR's persistently <60 mL/min signify possible Chronic Kidney Disease.   Georgiann Hahn gap 07/13/2018 4* 5 - 15 Final   Performed at [facility], [place] [address], [place], [place] [address]  . Prothrombin Time 07/13/2018 12.8  11.4 - 15.2 seconds Final  . INR 07/13/2018 0.97   Final   Performed at [place] [address] [place], [place] [address]  . WBC 07/14/2018 12.7* 4.0 - 10.5 K/uL Final  . RBC 07/14/2018 3.44* 4.22 -  5.81 MIL/uL Final  . Hemoglobin 07/14/2018 10.3* 13.0 - 17.0 g/dL Final  . HCT 07/14/2018 32.5* 39.0 - 52.0 % Final  . MCV 07/14/2018 94.5  80.0 - 100.0 fL Final  . MCH 07/14/2018 29.9  26.0 - 34.0 pg Final  . MCHC 07/14/2018 31.7  30.0 - 36.0 g/dL Final  . RDW 07/14/2018 12.1  11.5 - 15.5 % Final  . Platelets 07/14/2018 185  150 - 400 K/uL Final  . nRBC 07/14/2018 0.0  0.0 - 0.2 % Final   Performed at [facility], [place] [address] [place], [place] [address]  . Sodium 07/14/2018 140  135 - 145 mmol/L Final  . Potassium 07/14/2018 3.9  3.5 - 5.1 mmol/L  Final  . Chloride 07/14/2018 108  98 - 111 mmol/L Final  . CO2 07/14/2018 28  22 - 32 mmol/L Final  . Glucose, Bld 07/14/2018 112* 70 - 99 mg/dL Final  . BUN 07/14/2018 12  6 - 20 mg/dL Final  . Creatinine, Ser 07/14/2018 0.77  0.61 - 1.24 mg/dL Final  . Calcium 07/14/2018 8.3* 8.9 - 10.3 mg/dL Final  . GFR calc non Af Amer 07/14/2018 >60  >60 mL/min Final  . GFR calc Af Amer 07/14/2018 >60  >60 mL/min Final   Comment: (NOTE) The eGFR has been calculated using the CKD EPI equation. This calculation has not been validated in all clinical situations. eGFR's persistently <60 mL/min signify possible Chronic Kidney Disease.   Georgiann Hahn gap 07/14/2018 4* 5 - 15 Final   Performed at [facility], [place] [address] [place], [place] [address]  . Prothrombin Time 07/14/2018 12.8  11.4 - 15.2 seconds Final  . INR 07/14/2018 0.97   Final   Performed at [facility], [place] [address], [place], [place] [address]  Hospital Outpatient Visit on 07/06/2018  Component Date Value Ref Range Status  . aPTT 07/06/2018 28  24 - 36 seconds Final   Performed at [facility], [place] [address] [place], [place] [address]  . ABO/RH(D) 07/06/2018 O POS   Final  . Antibody Screen 07/06/2018 NEG   Final  . Sample Expiration 07/06/2018 07/15/2018   Final  . Extend sample reason 07/06/2018    Final                   Value:NO TRANSFUSIONS OR PREGNANCY IN THE PAST 3 MONTHS Performed at [facility], [place] [address], [place], [place] [address]   . MRSA, PCR 07/06/2018 NEGATIVE  NEGATIVE Final  . Staphylococcus aureus 07/06/2018 NEGATIVE  NEGATIVE Final   Comment: (NOTE) The Xpert SA Assay (FDA approved for NASAL specimens in patients 64 years of age and older), is one component of a comprehensive surveillance program. It is not intended to diagnose infection nor to guide or monitor treatment. Performed at [facility], [place]  [address], [place], [place] [address]   . ABO/RH(D) 07/06/2018    Final                   Value:O POS Performed at [facility], [place] [address] [place], [place] [address]   Admission on 06/16/2018, Discharged on 06/16/2018  Component Date Value Ref Range Status  . WBC 06/16/2018 10.2  4.0 - 10.5 K/uL Final  . RBC 06/16/2018 4.11* 4.22 - 5.81 MIL/uL Final  . Hemoglobin 06/16/2018 12.7* 13.0 - 17.0 g/dL Final  . HCT 06/16/2018 38.2* 39.0 - 52.0 %  Final  . MCV 06/16/2018 92.9  78.0 - 100.0 fL Final  . MCH 06/16/2018 30.9  26.0 - 34.0 pg Final  . MCHC 06/16/2018 33.2  30.0 - 36.0 g/dL Final  . RDW 06/16/2018 12.3  11.5 - 15.5 % Final  . Platelets 06/16/2018 195  150 - 400 K/uL Final  . Neutrophils Relative % 06/16/2018 74  % Final  . Neutro Abs 06/16/2018 7.6  1.7 - 7.7 K/uL Final  . Lymphocytes Relative 06/16/2018 16  % Final  . Lymphs Abs 06/16/2018 1.6  0.7 - 4.0 K/uL Final  . Monocytes Relative 06/16/2018 9  % Final  . Monocytes Absolute 06/16/2018 0.9  0.1 - 1.0 K/uL Final  . Eosinophils Relative 06/16/2018 1  % Final  . Eosinophils Absolute 06/16/2018 0.1  0.0 - 0.7 K/uL Final  . Basophils Relative 06/16/2018 0  % Final  . Basophils Absolute 06/16/2018 0.0  0.0 - 0.1 K/uL Final   Performed at [facility], [address], [place], [place] [address]  . Magnesium 06/16/2018 2.0  1.7 - 2.4 mg/dL Final   Performed at [facility], [address], [place], [place] [address]  . D-Dimer, Quant 06/16/2018 0.50  0.00 - 0.50 ug/mL-FEU Final   Comment: (NOTE) At the manufacturer cut-off of 0.50 ug/mL FEU, this assay has been documented to exclude PE with a sensitivity and negative predictive value of 97 to 99%.  At this time, this assay has not been approved by the FDA to exclude DVT/VTE. Results should be correlated with clinical presentation. Performed at [facility], [address], [place], [place] [address]   . Prothrombin Time 06/16/2018 13.3  11.4 - 15.2 seconds  Final  . INR 06/16/2018 1.02   Final   Performed at [facility], [address], [place], [place] [address]  . Sodium 06/16/2018 137  135 - 145 mmol/L Final  . Potassium 06/16/2018 4.1  3.5 - 5.1 mmol/L Final  . Chloride 06/16/2018 108  98 - 111 mmol/L Final  . CO2 06/16/2018 24  22 - 32 mmol/L Final  . Glucose, Bld 06/16/2018 112* 70 - 99 mg/dL Final  . BUN 06/16/2018 15  6 - 20 mg/dL Final  . Creatinine, Ser 06/16/2018 0.98  0.61 - 1.24 mg/dL Final  . Calcium 06/16/2018 8.5* 8.9 - 10.3 mg/dL Final  . Total Protein 06/16/2018 6.5  6.5 - 8.1 g/dL Final  . Albumin 06/16/2018 3.8  3.5 - 5.0 g/dL Final  . AST 06/16/2018 24  15 - 41 U/L Final  . ALT 06/16/2018 20  0 - 44 U/L Final  . Alkaline Phosphatase 06/16/2018 55  38 - 126 U/L Final  . Total Bilirubin 06/16/2018 0.6  0.3 - 1.2 mg/dL Final  . GFR calc non Af Amer 06/16/2018 >60  >60 mL/min Final  . GFR calc Af Amer 06/16/2018 >60  >60 mL/min Final   Comment: (NOTE) The eGFR has been calculated using the CKD EPI equation. This calculation has not been validated in all clinical situations. eGFR's persistently <60 mL/min signify possible Chronic Kidney Disease.   Georgiann Hahn gap 06/16/2018 5  5 - 15 Final   Performed at [facility], [address] [place], [place] [address]     X-Rays:Dg Chest 2 View  Result Date: 06/16/2018 CLINICAL DATA:  Syncope today. EXAM: CHEST - 2 VIEW COMPARISON:  None. FINDINGS: The lungs are clear. Heart size is normal. No pneumothorax or pleural fluid. No acute or focal bony abnormality. IMPRESSION: No acute disease.  Electronically Signed   By: Inge Rise M.D.   On: 06/16/2018 18:46    EKG: Orders placed or performed in visit on 07/07/18  . EKG 12-Lead     Hospital Course: Glendale Youngblood is a 62 y.o. who was admitted to [facility]. They were brought to the operating room on 07/12/2018 and underwent Procedure(s): Bilateral total knee arthroplasty.  Patient tolerated the procedure well and was  later transferred to the recovery room and then to the orthopaedic floor for postoperative care. They were given PO and IV analgesics for pain control following their surgery. They were given 24 hours of postoperative antibiotics of  Anti-infectives (From admission, onward)   Start     Dose/Rate Route Frequency Ordered Stop   07/12/18 1600  ceFAZolin (ANCEF) IVPB 2g/100 mL premix     2 g 200 mL/hr over 30 Minutes Intravenous Every 6 hours 07/12/18 1514 07/12/18 2133   07/12/18 0832  ceFAZolin (ANCEF) 2-4 GM/100ML-% IVPB    Note to Pharmacy:  Waldron Session   : cabinet override      07/12/18 0832 07/12/18 1520   07/11/18 1411  ceFAZolin (ANCEF) 3 g in dextrose 5 % 50 mL IVPB     3 g 100 mL/hr over 30 Minutes Intravenous 30 min pre-op 07/11/18 1411 07/12/18 1027     and started on DVT prophylaxis in the form of Coumadin with epidural in place. PT and OT were ordered for total joint protocol. Discharge planning consulted to help with postop disposition and equipment needs. Patient had a good night on the evening of surgery. They started to get up OOB with therapy on POD #1. Hemovac drains were pulled without difficulty on day one. On POD #2, dressings were changed and the incisions were clean, dry, and intact with minimal bloody drainage from the hemovac port site on the left knee. The epidural was removed on day two, and the foley catheter was pulled 6-8 hours afterwards. Patient continued to work with therapy into POD #3. DVT prophylaxis of coumadin was switched to Xarelto 10 mg QD on the third day due to discontinuation of the epidural. Incisions were healing well. He continued to work with therapy over the weekend. On POD #5, patient was ready for discharge and to [place]. He was discharged on day five in stable condition.  Diet: Regular diet Activity: WBAT Follow-up: in 2 weeks Disposition: [facility] Discharged Condition: stable   Discharge Instructions     Call MD / Call 911   Complete by:  As directed    If you experience chest pain or shortness of breath, CALL 911 and be transported to the hospital emergency room.  If you develope a fever above 101 F, pus (white drainage) or increased drainage or redness at the wound, or calf pain, call your surgeon's office.   Change dressing   Complete by:  As directed    Change the dressing daily with sterile 4 x 4 inch gauze dressing and apply TED hose.   Constipation Prevention   Complete by:  As directed    Drink plenty of fluids.  Prune juice may be helpful.  You may use a stool softener, such as Colace (over the counter) 100 mg twice a day.  Use MiraLax (over the counter) for constipation as needed.   Diet - low sodium heart healthy   Complete by:  As directed    Discharge instructions   Complete by:  As directed  Dr. Gaynelle Arabian Total Joint Specialist [facility] [address], [place], [place] [address] [phone]  TOTAL KNEE REPLACEMENT POSTOPERATIVE DIRECTIONS  Knee Rehabilitation, Guidelines Following Surgery  Results after knee surgery are often greatly improved when you follow the exercise, range of motion and muscle strengthening exercises prescribed by your doctor. Safety measures are also important to protect the knee from further injury. Any time any of these exercises cause you to have increased pain or swelling in your knee joint, decrease the amount until you are comfortable again and slowly increase them. If you have problems or questions, call your caregiver or physical therapist for advice.   HOME CARE INSTRUCTIONS  Remove items at home which could result in a fall. This includes throw rugs or furniture in walking pathways.  ICE to the affected knee every three hours for 30 minutes at a time and then as needed for pain and swelling.  Continue to use ice on the knee for pain and swelling from surgery. You may notice swelling that will progress down to the foot and  ankle.  This is normal after surgery.  Elevate the leg when you are not up walking on it.   Continue to use the breathing machine which will help keep your temperature down.  It is common for your temperature to cycle up and down following surgery, especially at night when you are not up moving around and exerting yourself.  The breathing machine keeps your lungs expanded and your temperature down. Do not place pillow under knee, focus on keeping the knee straight while resting   DIET You may resume your previous home diet once your are discharged from the hospital.  DRESSING / WOUND CARE / SHOWERING You may shower 3 days after surgery, but keep the wounds dry during showering.  You may use an occlusive plastic wrap (Press'n Seal for example), NO SOAKING/SUBMERGING IN THE BATHTUB.  If the bandage gets wet, change with a clean dry gauze.  If the incision gets wet, pat the wound dry with a clean towel. You may start showering once you are discharged home but do not submerge the incision under water. Just pat the incision dry and apply a dry gauze dressing on daily. Change the surgical dressing daily and reapply a dry dressing each time.  ACTIVITY Walk with your walker as instructed. Use walker as long as suggested by your caregivers. Avoid periods of inactivity such as sitting longer than an hour when not asleep. This helps prevent blood clots.  You may resume a sexual relationship in one month or when given the OK by your doctor.  You may return to work once you are cleared by your doctor.  Do not drive a car for 6 weeks or until released by you surgeon.  Do not drive while taking narcotics.  WEIGHT BEARING Weight bearing as tolerated with assist device (walker, cane, etc) as directed, use it as long as suggested by your surgeon or therapist, typically at least 4-6 weeks.  POSTOPERATIVE CONSTIPATION PROTOCOL Constipation - defined medically as fewer than three stools per week and severe  constipation as less than one stool per week.  One of the most common issues patients have following surgery is constipation.  Even if you have a regular bowel pattern at home, your normal regimen is likely to be disrupted due to multiple reasons following surgery.  Combination of anesthesia, postoperative narcotics, change in appetite and fluid intake all can affect your bowels.  In order  to avoid complications following surgery, here are some recommendations in order to help you during your recovery period.  Colace (docusate) - Pick up an over-the-counter form of Colace or another stool softener and take twice a day as long as you are requiring postoperative pain medications.  Take with a full glass of water daily.  If you experience loose stools or diarrhea, hold the colace until you stool forms back up.  If your symptoms do not get better within 1 week or if they get worse, check with your doctor.  Dulcolax (bisacodyl) - Pick up over-the-counter and take as directed by the product packaging as needed to assist with the movement of your bowels.  Take with a full glass of water.  Use this product as needed if not relieved by Colace only.   MiraLax (polyethylene glycol) - Pick up over-the-counter to have on hand.  MiraLax is a solution that will increase the amount of water in your bowels to assist with bowel movements.  Take as directed and can mix with a glass of water, juice, soda, coffee, or tea.  Take if you go more than two days without a movement. Do not use MiraLax more than once per day. Call your doctor if you are still constipated or irregular after using this medication for 7 days in a row.  If you continue to have problems with postoperative constipation, please contact the office for further assistance and recommendations.  If you experience "the worst abdominal pain ever" or develop nausea or vomiting, please contact the office immediatly for further recommendations for  treatment.  ITCHING  If you experience itching with your medications, try taking only a single pain pill, or even half a pain pill at a time.  You can also use Benadryl over the counter for itching or also to help with sleep.   TED HOSE STOCKINGS Wear the elastic stockings on both legs for three weeks following surgery during the day but you may remove then at night for sleeping.  MEDICATIONS See your medication summary on the "After Visit Summary" that the nursing staff will review with you prior to discharge.  You may have some home medications which will be placed on hold until you complete the course of blood thinner medication.  It is important for you to complete the blood thinner medication as prescribed by your surgeon.  Continue your approved medications as instructed at time of discharge.  PRECAUTIONS If you experience chest pain or shortness of breath - call 911 immediately for transfer to the hospital emergency department.  If you develop a fever greater that 101 F, purulent drainage from wound, increased redness or drainage from wound, foul odor from the wound/dressing, or calf pain - CONTACT YOUR SURGEON.                                                   FOLLOW-UP APPOINTMENTS Make sure you keep all of your appointments after your operation with your surgeon and caregivers. You should call the office at the above phone number and make an appointment for approximately two weeks after the date of your surgery or on the date instructed by your surgeon outlined in the "After Visit Summary".   RANGE OF MOTION AND STRENGTHENING EXERCISES  Rehabilitation of the knee is important following a knee injury or an operation. After  just a few days of immobilization, the muscles of the thigh which control the knee become weakened and shrink (atrophy). Knee exercises are designed to build up the tone and strength of the thigh muscles and to improve knee motion. Often times heat used for twenty to  thirty minutes before working out will loosen up your tissues and help with improving the range of motion but do not use heat for the first two weeks following surgery. These exercises can be done on a training (exercise) mat, on the floor, on a table or on a bed. Use what ever works the best and is most comfortable for you Knee exercises include:  Leg Lifts - While your knee is still immobilized in a splint or cast, you can do straight leg raises. Lift the leg to 60 degrees, hold for 3 sec, and slowly lower the leg. Repeat 10-20 times 2-3 times daily. Perform this exercise against resistance later as your knee gets better.  Quad and Hamstring Sets - Tighten up the muscle on the front of the thigh (Quad) and hold for 5-10 sec. Repeat this 10-20 times hourly. Hamstring sets are done by pushing the foot backward against an object and holding for 5-10 sec. Repeat as with quad sets.  Leg Slides: Lying on your back, slowly slide your foot toward your buttocks, bending your knee up off the floor (only go as far as is comfortable). Then slowly slide your foot back down until your leg is flat on the floor again. Angel Wings: Lying on your back spread your legs to the side as far apart as you can without causing discomfort.  A rehabilitation program following serious knee injuries can speed recovery and prevent re-injury in the future due to weakened muscles. Contact your doctor or a physical therapist for more information on knee rehabilitation.   IF YOU ARE TRANSFERRED TO A SKILLED REHAB FACILITY If the patient is transferred to a skilled rehab facility following release from the hospital, a list of the current medications will be sent to the facility for the patient to continue.  When discharged from the skilled rehab facility, please have the facility set up the patient's [place] prior to being released. Also, the skilled facility will be responsible for providing the patient with their  medications at time of release from the facility to include their pain medication, the muscle relaxants, and their blood thinner medication. If the patient is still at the rehab facility at time of the two week follow up appointment, the skilled rehab facility will also need to assist the patient in arranging follow up appointment in our office and any transportation needs.  MAKE SURE YOU:  Understand these instructions.  Get help right away if you are not doing well or get worse.    Pick up stool softner and laxative for home use following surgery while on pain medications. Do not submerge incision under water. Please use good hand washing techniques while changing dressing each day. May shower starting three days after surgery. Please use a clean towel to pat the incision dry following showers. Continue to use ice for pain and swelling after surgery. Do not use any lotions or creams on the incision until instructed by your surgeon.   Do not put a pillow under the knee. Place it under the heel.   Complete by:  As directed    Driving restrictions   Complete by:  As directed    No driving for two weeks  TED hose   Complete by:  As directed    Use stockings (TED hose) for three weeks on both leg(s).  You may remove them at night for sleeping.   Weight bearing as tolerated   Complete by:  As directed      Allergies as of 07/14/2018   No Known Allergies     Medication List    STOP taking these medications   Fish Oil 1000 MG Caps   FLAX SEED OIL PO   glucosamine-chondroitin 500-400 MG tablet   MULTIVITAMIN PO   naproxen sodium 220 MG tablet Commonly known as:  ALEVE   Red Yeast Rice 600 MG Caps   vitamin C 500 MG tablet Commonly known as:  ASCORBIC ACID     TAKE these medications   DRY EYES OP Place 1 drop into both eyes 2 (two) times daily.   methocarbamol 500 MG tablet Commonly known as:  ROBAXIN Take 1 tablet (500 mg total) by mouth every 6 (six) hours as needed  for muscle spasms.   oxyCODONE 5 MG immediate release tablet Commonly known as:  Oxy IR/ROXICODONE Take 1-2 tablets (5-10 mg total) by mouth every 6 (six) hours as needed for moderate pain (pain score 4-6).   rivaroxaban 10 MG Tabs tablet Commonly known as:  XARELTO Take 1 tablet (10 mg total) by mouth daily for 21 days. Take 10 mg xarelto once a day for three weeks. Then take one 81 mg aspirin once a day for three weeks. Then discontinue aspirin. Start taking on:  07/15/2018   traMADol 50 MG tablet Commonly known as:  ULTRAM Take 1-2 tablets (50-100 mg total) by mouth every 6 (six) hours as needed for moderate pain (if unresponsive to Oxycodone).            Discharge Care Instructions  (From admission, onward)         Start     Ordered   07/13/18 0000  Weight bearing as tolerated     07/13/18 0815   07/13/18 0000  Change dressing    Comments:  Change the dressing daily with sterile 4 x 4 inch gauze dressing and apply TED hose.   07/13/18 0815         Follow-up Information    Gaynelle Arabian, MD. Schedule an appointment as soon as possible for a visit on 07/27/2018.   Specialty:  Orthopedic Surgery Contact information: [address] [place] [address] [phone]           Signed: Theresa Duty, PA-C Orthopedic Surgery 07/14/2018, 3:27 PM

## 2018-07-15 LAB — PROTIME-INR
INR: 1.02
Prothrombin Time: 13.3 seconds (ref 11.4–15.2)

## 2018-07-15 LAB — BASIC METABOLIC PANEL
Anion gap: 7 (ref 5–15)
BUN: 14 mg/dL (ref 6–20)
CALCIUM: 8 mg/dL — AB (ref 8.9–10.3)
CHLORIDE: 103 mmol/L (ref 98–111)
CO2: 26 mmol/L (ref 22–32)
Creatinine, Ser: 0.87 mg/dL (ref 0.61–1.24)
GFR calc non Af Amer: >60 mL/min (ref >60)
GLUCOSE: 118 mg/dL — AB (ref 70–99)
POTASSIUM: 4 mmol/L (ref 3.5–5.1)
Sodium: 136 mmol/L (ref 135–145)

## 2018-07-15 LAB — CBC
HEMATOCRIT: 30.6 % — AB (ref 39.0–52.0)
HEMOGLOBIN: 9.8 g/dL — AB (ref 13.0–17.0)
MCH: 30.2 pg (ref 26.0–34.0)
MCHC: 32 g/dL (ref 30.0–36.0)
MCV: 94.4 fL (ref 80.0–100.0)
Platelets: 177 10*3/uL (ref 150–400)
RBC: 3.24 MIL/uL — AB (ref 4.22–5.81)
RDW: 11.9 % (ref 11.5–15.5)
WBC: 11.9 10*3/uL — AB (ref 4.0–10.5)
nRBC: 0 % (ref 0.0–0.2)

## 2018-07-15 MED ORDER — RIVAROXABAN 10 MG PO TABS
10.0000 mg | ORAL_TABLET | Freq: Every day | ORAL | Status: DC
  Administered 2018-07-15 – 2018-07-17 (×3): 10 mg via ORAL
  Filled 2018-07-15 (×3): qty 1

## 2018-07-15 NOTE — Progress Notes (Signed)
Brief Pharmacy Note:  Per [facility] and documentation, epidural removed on 11/1 @ 1700. Surgery ordered rivaroxaban 10 mg PO daily to begin today @ 1000, which is >6 hours after epidural removal. Warfarin consult discontinued.  Cindi Carbon, PharmD 07/15/18 8:28 AM

## 2018-07-15 NOTE — Progress Notes (Signed)
    Subjective:  Patient reports pain as moderate to severe.  Denies N/V/CP/SOB. Epidural d/c'd yesterday.  Objective:   VITALS:   Vitals:   07/14/18 2000 07/14/18 2144 07/15/18 0233 07/15/18 0529  BP:  134/75 127/79 111/64  Pulse: 78  83 91  Resp: 20   18  Temp:  98.4 F (36.9 C) 98.4 F (36.9 C) 97.9 F (36.6 C)  TempSrc:  Oral Oral Oral  SpO2:   97% 96%  Weight:      Height:        NAD ABD soft Sensation intact distally Intact pulses distally Dorsiflexion/Plantar flexion intact Incision: dressing C/D/I Compartment soft   Lab Results  Component Value Date   WBC 11.9 (H) 07/15/2018   HGB 9.8 (L) 07/15/2018   HCT 30.6 (L) 07/15/2018   MCV 94.4 07/15/2018   PLT 177 07/15/2018   BMET    Component Value Date/Time   NA 136 07/15/2018 0337   K 4.0 07/15/2018 0337   CL 103 07/15/2018 0337   CO2 26 07/15/2018 0337   GLUCOSE 118 (H) 07/15/2018 0337   BUN 14 07/15/2018 0337   CREATININE 0.87 07/15/2018 0337   CALCIUM 8.0 (L) 07/15/2018 0337   GFRNONAA >60 07/15/2018 0337   GFRAA >60 07/15/2018 0337     Assessment/Plan: 3 Days Post-Op   Principal Problem:   OA (osteoarthritis) of knee   WBAT with walker DVT ppx: Xarelto, SCDs, TEDS PO pain control PT/OT Dispo: IP rehab approval pending   Jonette Pesa 07/15/2018, 9:14 AM   Samson Frederic, MD Cell: [phone] [facility] is now [facility]  [place] [address], [address], [place], [place] [address] Phone: [phone] [email or web address] Facebook  [employer]

## 2018-07-15 NOTE — Progress Notes (Signed)
Physical Therapy Treatment Patient Details Name: Franklin Lucas MRN: [id number] DOB: 07-27-1956 Today's Date: 07/15/2018    History of Present Illness s/p bil TKA.  H/O syncope    PT Comments    Marked improvement in pt ability to participate with dc of epidural.  Pt progressed to ambulate short distance in room this am.   Follow Up Recommendations  CIR     Equipment Recommendations  None recommended by PT    Recommendations for Other Services       Precautions / Restrictions Precautions Precautions: Fall;Knee Required Braces or Orthoses: Knee Immobilizer - Right;Knee Immobilizer - Left Knee Immobilizer - Right: Discontinue once straight leg raise with < 10 degree lag Knee Immobilizer - Left: Discontinue once straight leg raise with < 10 degree lag Restrictions Weight Bearing Restrictions: No Other Position/Activity Restrictions: WBAT    Mobility  Bed Mobility Overal bed mobility: Needs Assistance Bed Mobility: Supine to Sit;Sit to Supine     Supine to sit: +2 for physical assistance;+2 for safety/equipment;Min assist;Mod assist Sit to supine: +2 for physical assistance;+2 for safety/equipment;Min assist;Mod assist   General bed mobility comments: oob by PT  Transfers Overall transfer level: Needs assistance Equipment used: Rolling walker (2 wheeled) Transfers: Sit to/from Stand Sit to Stand: Mod assist;+2 physical assistance;+2 safety/equipment;From elevated surface         General transfer comment: bed elevated  Ambulation/Gait Ambulation/Gait assistance: Min assist;Mod assist;+2 physical assistance;+2 safety/equipment Gait Distance (Feet): 18 Feet Assistive device: Rolling walker (2 wheeled) Gait Pattern/deviations: Step-to pattern;Decreased step length - right;Decreased step length - left;Shuffle;Trunk flexed Gait velocity: decr   General Gait Details: cues for sequence, posture and position from RW.   Stairs             Wheelchair Mobility     Modified Rankin (Stroke Patients Only)       Balance Overall balance assessment: Needs assistance Sitting-balance support: Feet supported Sitting balance-Leahy Scale: Good     Standing balance support: Bilateral upper extremity supported Standing balance-Leahy Scale: Poor                              Cognition Arousal/Alertness: Awake/alert Behavior During Therapy: WFL for tasks assessed/performed Overall Cognitive Status: Within Functional Limits for tasks assessed                                        Exercises Total Joint Exercises Ankle Circles/Pumps: AROM;Both;15 reps;Supine Quad Sets: AROM;AAROM;Both;10 reps;Supine Heel Slides: AAROM;Both;15 reps;Supine Straight Leg Raises: AAROM;Both;10 reps;Supine Goniometric ROM: AAROM bil knees -10 - 80    General Comments        Pertinent Vitals/Pain Pain Assessment: 0-10 Pain Score: 3  Pain Location: bil knees Pain Descriptors / Indicators: Sore Pain Intervention(s): Monitored during session;Limited activity within patient's tolerance;Premedicated before session;Ice applied    Home Living                      Prior Function            PT Goals (current goals can now be found in the care plan section) Acute Rehab PT Goals Patient Stated Goal: CIR and home PT Goal Formulation: With patient Time For Goal Achievement: 07/20/18 Potential to Achieve Goals: Good Progress towards PT goals: Progressing toward goals    Frequency    7X/week  PT Plan Current plan remains appropriate    Co-evaluation PT/OT/SLP Co-Evaluation/Treatment: Yes Reason for Co-Treatment: For patient/therapist safety PT goals addressed during session: Mobility/safety with mobility OT goals addressed during session: ADL's and self-care      AM-PAC PT "6 Clicks" Daily Activity  Outcome Measure  Difficulty turning over in bed (including adjusting bedclothes, sheets and blankets)?:  Unable Difficulty moving from lying on back to sitting on the side of the bed? : Unable Difficulty sitting down on and standing up from a chair with arms (e.g., wheelchair, bedside commode, etc,.)?: Unable Help needed moving to and from a bed to chair (including a wheelchair)?: A Lot Help needed walking in hospital room?: A Lot Help needed climbing 3-5 steps with a railing? : Total 6 Click Score: 8    End of Session Equipment Utilized During Treatment: Right knee immobilizer;Left knee immobilizer;Gait belt Activity Tolerance: Patient tolerated treatment well;Patient limited by fatigue Patient left: in bed;with call bell/phone within reach;with family/visitor present Nurse Communication: Mobility status PT Visit Diagnosis: Muscle weakness (generalized) (M62.81);Difficulty in walking, not elsewhere classified (R26.2);Pain Pain - Right/Left: Right Pain - part of body: Knee     Time: 1610-9604 PT Time Calculation (min) (ACUTE ONLY): 45 min  Charges:  $Gait Training: 8-22 mins $Therapeutic Exercise: 8-22 mins $Therapeutic Activity: 8-22 mins                     Mauro Kaufmann PT Acute Rehabilitation Services Pager [phone] Office [phone]    Franklin Lucas 07/15/2018, 12:52 PM

## 2018-07-15 NOTE — Progress Notes (Signed)
Physical Therapy Treatment Patient Details Name: Keywon Mestre MRN: [id number] DOB: Aug 09, 1956 Today's Date: 07/15/2018    History of Present Illness s/p bil TKA.  H/O syncope    PT Comments    Pt continues very motivated and progressing steadily with mobility.  Pt continues to be excellent candidate for CIR level rehab to maximize IND and safety prior to return home with limited assist.   Follow Up Recommendations  CIR     Equipment Recommendations  None recommended by PT    Recommendations for Other Services       Precautions / Restrictions Precautions Precautions: Fall;Knee Required Braces or Orthoses: Knee Immobilizer - Right;Knee Immobilizer - Left Knee Immobilizer - Right: Discontinue once straight leg raise with < 10 degree lag Knee Immobilizer - Left: Discontinue once straight leg raise with < 10 degree lag Restrictions Weight Bearing Restrictions: No Other Position/Activity Restrictions: WBAT    Mobility  Bed Mobility Overal bed mobility: Needs Assistance Bed Mobility: Supine to Sit;Sit to Supine     Supine to sit: +2 for physical assistance;+2 for safety/equipment;Min assist;Mod assist Sit to supine: +2 for physical assistance;+2 for safety/equipment;Min assist;Mod assist   General bed mobility comments: cues for sequence with assist to manage bil LEs and to steady trunk  Transfers Overall transfer level: Needs assistance Equipment used: Rolling walker (2 wheeled) Transfers: Sit to/from Stand Sit to Stand: Min assist;Mod assist;+2 physical assistance;+2 safety/equipment;From elevated surface         General transfer comment: bed elevated very high  Ambulation/Gait Ambulation/Gait assistance: Min assist;Mod assist;+2 physical assistance;+2 safety/equipment Gait Distance (Feet): 24 Feet(twice) Assistive device: Rolling walker (2 wheeled) Gait Pattern/deviations: Step-to pattern;Decreased step length - right;Decreased step length - left;Shuffle;Trunk  flexed Gait velocity: decr   General Gait Details: cues for sequence, posture and position from RW.   Stairs             Wheelchair Mobility    Modified Rankin (Stroke Patients Only)       Balance Overall balance assessment: Needs assistance Sitting-balance support: Feet supported Sitting balance-Leahy Scale: Good     Standing balance support: Bilateral upper extremity supported Standing balance-Leahy Scale: Poor                              Cognition Arousal/Alertness: Awake/alert Behavior During Therapy: WFL for tasks assessed/performed Overall Cognitive Status: Within Functional Limits for tasks assessed                                        Exercises Total Joint Exercises Ankle Circles/Pumps: AROM;Both;15 reps;Supine Quad Sets: AROM;AAROM;Both;10 reps;Supine Heel Slides: AAROM;Both;15 reps;Supine Straight Leg Raises: AAROM;Both;10 reps;Supine Goniometric ROM: AAROM bil knees -10 - 80    General Comments        Pertinent Vitals/Pain Pain Assessment: 0-10 Pain Score: 3  Pain Location: bil knees Pain Descriptors / Indicators: Sore Pain Intervention(s): Limited activity within patient's tolerance;Monitored during session;Premedicated before session;Ice applied    Home Living                      Prior Function            PT Goals (current goals can now be found in the care plan section) Acute Rehab PT Goals Patient Stated Goal: CIR and home PT Goal Formulation: With patient Time For Goal Achievement:  07/20/18 Potential to Achieve Goals: Good Progress towards PT goals: Progressing toward goals    Frequency    7X/week      PT Plan Current plan remains appropriate    Co-evaluation PT/OT/SLP Co-Evaluation/Treatment: Yes Reason for Co-Treatment: For patient/therapist safety PT goals addressed during session: Mobility/safety with mobility OT goals addressed during session: ADL's and self-care       AM-PAC PT "6 Clicks" Daily Activity  Outcome Measure  Difficulty turning over in bed (including adjusting bedclothes, sheets and blankets)?: Unable Difficulty moving from lying on back to sitting on the side of the bed? : Unable Difficulty sitting down on and standing up from a chair with arms (e.g., wheelchair, bedside commode, etc,.)?: Unable Help needed moving to and from a bed to chair (including a wheelchair)?: A Lot Help needed walking in hospital room?: A Lot Help needed climbing 3-5 steps with a railing? : Total 6 Click Score: 8    End of Session Equipment Utilized During Treatment: Right knee immobilizer;Left knee immobilizer;Gait belt Activity Tolerance: Patient tolerated treatment well;Patient limited by fatigue Patient left: in bed;with call bell/phone within reach;with family/visitor present Nurse Communication: Mobility status PT Visit Diagnosis: Muscle weakness (generalized) (M62.81);Difficulty in walking, not elsewhere classified (R26.2);Pain Pain - Right/Left: Left Pain - part of body: Knee     Time: 8295-6213 PT Time Calculation (min) (ACUTE ONLY): 30 min  Charges:  $Gait Training: 23-37 mins $Therapeutic Exercise: 8-22 mins $Therapeutic Activity: 8-22 mins                     Mauro Kaufmann PT Acute Rehabilitation Services Pager [phone] Office [phone]    Lashena Signer 07/15/2018, 4:13 PM

## 2018-07-15 NOTE — Progress Notes (Signed)
Occupational Therapy Treatment Patient Details Name: Franklin Lucas MRN: [id number] DOB: 07-21-1956 Today's Date: 07/15/2018    History of present illness s/p bil TKA.  H/O syncope   OT comments  Pt able to take steps forward and backwards in room today. Not ready for standing grooming tasks yet.   Will need to raise 3:1 on platform due to pt's height and bil TKA.  Using both KIs  Follow Up Recommendations  CIR    Equipment Recommendations  3 in 1 bedside commode    Recommendations for Other Services      Precautions / Restrictions Precautions Precautions: Fall;Knee Required Braces or Orthoses: Knee Immobilizer - Right;Knee Immobilizer - Left Knee Immobilizer - Right: Discontinue once straight leg raise with < 10 degree lag Knee Immobilizer - Left: Discontinue once straight leg raise with < 10 degree lag Restrictions Other Position/Activity Restrictions: WBAT       Mobility Bed Mobility               General bed mobility comments: oob by PT  Transfers   Equipment used: Rolling walker (2 wheeled)   Sit to Stand: Mod assist;+2 physical assistance;+2 safety/equipment;From elevated surface         General transfer comment: bed elevated    Balance                                           ADL either performed or assessed with clinical judgement   ADL                           Toilet Transfer: Moderate assistance;+2 for physical assistance;RW(simulated--high bed)             General ADL Comments: worked on functional mobility in room.  Pt is 6'5"; anticipate he will need 3;1 raised on step platform to lift higher     Vision       Perception     Praxis      Cognition Arousal/Alertness: Awake/alert Behavior During Therapy: WFL for tasks assessed/performed Overall Cognitive Status: Within Functional Limits for tasks assessed                                          Exercises     Shoulder  Instructions       General Comments      Pertinent Vitals/ Pain       Pain Score: 3  Pain Location: bil knees Pain Descriptors / Indicators: Sore Pain Intervention(s): Limited activity within patient's tolerance;Monitored during session;Repositioned;Ice applied;Premedicated before session  Home Living                                          Prior Functioning/Environment              Frequency           Progress Toward Goals  OT Goals(current goals can now be found in the care plan section)  Progress towards OT goals: Progressing toward goals     Plan      Co-evaluation  AM-PAC PT "6 Clicks" Daily Activity     Outcome Measure   Help from another person eating meals?: None Help from another person taking care of personal grooming?: A Little Help from another person toileting, which includes using toliet, bedpan, or urinal?: A Lot Help from another person bathing (including washing, rinsing, drying)?: A Lot Help from another person to put on and taking off regular upper body clothing?: A Little Help from another person to put on and taking off regular lower body clothing?: Total 6 Click Score: 15    End of Session    OT Visit Diagnosis: Pain Pain - Right/Left: (bil) Pain - part of body: Knee   Activity Tolerance Patient tolerated treatment well   Patient Left in bed;with call bell/phone within reach;with family/visitor present   Nurse Communication          Time: 1610-9604 OT Time Calculation (min): 13 min  Charges: OT General Charges $OT Visit: 1 Visit OT Evaluation $OT Eval Low Complexity: (no charge; co tx)  Franklin Lucas, OTR/L Acute Rehabilitation Services [phone] [facility] pager [phone] office 07/15/2018   Franklin Lucas 07/15/2018, 12:38 PM

## 2018-07-16 LAB — CBC
HEMATOCRIT: 26.7 % — AB (ref 39.0–52.0)
Hemoglobin: 8.6 g/dL — ABNORMAL LOW (ref 13.0–17.0)
MCH: 30.5 pg (ref 26.0–34.0)
MCHC: 32.2 g/dL (ref 30.0–36.0)
MCV: 94.7 fL (ref 80.0–100.0)
NRBC: 0 % (ref 0.0–0.2)
Platelets: 174 10*3/uL (ref 150–400)
RBC: 2.82 MIL/uL — AB (ref 4.22–5.81)
RDW: 11.9 % (ref 11.5–15.5)
WBC: 8.9 10*3/uL (ref 4.0–10.5)

## 2018-07-16 LAB — PROTIME-INR
INR: 1.2
Prothrombin Time: 15.1 seconds (ref 11.4–15.2)

## 2018-07-16 MED ORDER — HYDROCORTISONE 1 % EX CREA
TOPICAL_CREAM | Freq: Two times a day (BID) | CUTANEOUS | Status: DC
  Administered 2018-07-16 – 2018-07-17 (×2): via TOPICAL
  Filled 2018-07-16: qty 28

## 2018-07-16 NOTE — Plan of Care (Signed)
  Problem: Clinical Measurements: Goal: Ability to maintain clinical measurements within normal limits will improve Outcome: Progressing Goal: Will remain free from infection Outcome: Progressing Goal: Diagnostic test results will improve Outcome: Progressing   Problem: Activity: Goal: Risk for activity intolerance will decrease Outcome: Progressing   Problem: Elimination: Goal: Will not experience complications related to bowel motility Outcome: Progressing   Problem: Pain Managment: Goal: General experience of comfort will improve Outcome: Progressing   Problem: Activity: Goal: Ability to avoid complications of mobility impairment will improve Outcome: Progressing Goal: Range of joint motion will improve Outcome: Progressing   Problem: Clinical Measurements: Goal: Postoperative complications will be avoided or minimized Outcome: Progressing   Problem: Pain Management: Goal: Pain level will decrease with appropriate interventions Outcome: Progressing

## 2018-07-16 NOTE — Progress Notes (Signed)
Physical Therapy Treatment Patient Details Name: Franklin Lucas MRN: [id number] DOB: [birth year] Today's Date: 07/16/2018    History of Present Illness s/p bil TKA.  H/O syncope    PT Comments    Therex program performed with assist.  OOB deferred to after bfast and next pain meds.   Follow Up Recommendations  CIR     Equipment Recommendations  None recommended by PT    Recommendations for Other Services       Precautions / Restrictions Precautions Precautions: Fall;Knee Required Braces or Orthoses: Knee Immobilizer - Right;Knee Immobilizer - Left Knee Immobilizer - Right: Discontinue once straight leg raise with < 10 degree lag Knee Immobilizer - Left: Discontinue once straight leg raise with < 10 degree lag Restrictions Weight Bearing Restrictions: No Other Position/Activity Restrictions: WBAT    Mobility  Bed Mobility                  Transfers                    Ambulation/Gait                 Stairs             Wheelchair Mobility    Modified Rankin (Stroke Patients Only)       Balance                                            Cognition Arousal/Alertness: Awake/alert Behavior During Therapy: WFL for tasks assessed/performed Overall Cognitive Status: Within Functional Limits for tasks assessed                                        Exercises Total Joint Exercises Ankle Circles/Pumps: AROM;Both;Supine;20 reps Quad Sets: AROM;AAROM;Both;Supine;15 reps Heel Slides: AAROM;Both;15 reps;Supine Straight Leg Raises: AAROM;Both;Supine;15 reps Goniometric ROM: AAROM R knee -10-  80, L knee -10 - 75    General Comments        Pertinent Vitals/Pain Pain Assessment: 0-10 Pain Score: 4  Pain Location: bil knees at session ends  Pain Descriptors / Indicators: Sore Pain Intervention(s): Limited activity within patient's tolerance;Monitored during session;Ice applied;Premedicated before  session    Home Living                      Prior Function            PT Goals (current goals can now be found in the care plan section) Acute Rehab PT Goals Patient Stated Goal: CIR and home PT Goal Formulation: With patient Time For Goal Achievement: 07/20/18 Potential to Achieve Goals: Good Progress towards PT goals: Progressing toward goals    Frequency    7X/week      PT Plan Current plan remains appropriate    Co-evaluation              AM-PAC PT "6 Clicks" Daily Activity  Outcome Measure  Difficulty turning over in bed (including adjusting bedclothes, sheets and blankets)?: Unable Difficulty moving from lying on back to sitting on the side of the bed? : Unable Difficulty sitting down on and standing up from a chair with arms (e.g., wheelchair, bedside commode, etc,.)?: Unable Help needed moving to and from a bed to chair (including a wheelchair)?: A Lot  Help needed walking in hospital room?: A Lot Help needed climbing 3-5 steps with a railing? : Total 6 Click Score: 8    End of Session   Activity Tolerance: Patient tolerated treatment well;Patient limited by pain Patient left: in bed;with call bell/phone within reach;with family/visitor present Nurse Communication: Mobility status PT Visit Diagnosis: Muscle weakness (generalized) (M62.81);Difficulty in walking, not elsewhere classified (R26.2);Pain Pain - Right/Left: Right Pain - part of body: Knee     Time: 0750-0824 PT Time Calculation (min) (ACUTE ONLY): 34 min  Charges:  $Therapeutic Exercise: 23-37 mins                     Mauro Kaufmann PT Acute Rehabilitation Services Pager [phone] Office [phone]    [name] 07/16/2018, 8:35 AM

## 2018-07-16 NOTE — Progress Notes (Signed)
Physical Therapy Treatment Patient Details Name: Franklin Lucas MRN: [id number] DOB: 1956-03-04 Today's Date: 07/16/2018    History of Present Illness s/p bil TKA.  H/O syncope    PT Comments    Pt continues very motivated and progressing steadily with mobility.  Pt would greatly benefit from follow up rehab at CIR level to maximize IND and safety prior to return home with limited assist.   Follow Up Recommendations  CIR     Equipment Recommendations  None recommended by PT    Recommendations for Other Services       Precautions / Restrictions Precautions Precautions: Fall;Knee Required Braces or Orthoses: Knee Immobilizer - Right;Knee Immobilizer - Left Knee Immobilizer - Right: Discontinue once straight leg raise with < 10 degree lag Knee Immobilizer - Left: Discontinue once straight leg raise with < 10 degree lag Restrictions Weight Bearing Restrictions: No Other Position/Activity Restrictions: WBAT    Mobility  Bed Mobility Overal bed mobility: Needs Assistance Bed Mobility: Supine to Sit;Sit to Supine     Supine to sit: Mod assist Sit to supine: Mod assist   General bed mobility comments: assist to manage Bil LEs   Transfers Overall transfer level: Needs assistance Equipment used: Rolling walker (2 wheeled) Transfers: Sit to/from Stand Sit to Stand: Min assist;Mod assist;+2 physical assistance;+2 safety/equipment;From elevated surface         General transfer comment: bed elevated very high  Ambulation/Gait Ambulation/Gait assistance: Min assist;+2 physical assistance;+2 safety/equipment Gait Distance (Feet): 28 Feet(twice) Assistive device: Rolling walker (2 wheeled) Gait Pattern/deviations: Step-to pattern;Decreased step length - right;Decreased step length - left;Shuffle;Trunk flexed Gait velocity: decr   General Gait Details: Multiple standing rest breaks with cues for sequence, posture and position from RW.   Stairs             Wheelchair  Mobility    Modified Rankin (Stroke Patients Only)       Balance                                            Cognition Arousal/Alertness: Awake/alert Behavior During Therapy: WFL for tasks assessed/performed Overall Cognitive Status: Within Functional Limits for tasks assessed                                        Exercises      General Comments        Pertinent Vitals/Pain Pain Assessment: 0-10 Pain Score: 4  Pain Location: bil knees at session ends  Pain Descriptors / Indicators: Sore Pain Intervention(s): Limited activity within patient's tolerance;Monitored during session;Premedicated before session;Ice applied    Home Living                      Prior Function            PT Goals (current goals can now be found in the care plan section) Acute Rehab PT Goals Patient Stated Goal: CIR and home PT Goal Formulation: With patient Time For Goal Achievement: 07/20/18 Potential to Achieve Goals: Good Progress towards PT goals: Progressing toward goals    Frequency    7X/week      PT Plan Current plan remains appropriate    Co-evaluation              AM-PAC  PT "6 Clicks" Daily Activity  Outcome Measure  Difficulty turning over in bed (including adjusting bedclothes, sheets and blankets)?: Unable Difficulty moving from lying on back to sitting on the side of the bed? : Unable Difficulty sitting down on and standing up from a chair with arms (e.g., wheelchair, bedside commode, etc,.)?: Unable Help needed moving to and from a bed to chair (including a wheelchair)?: A Lot Help needed walking in hospital room?: A Lot Help needed climbing 3-5 steps with a railing? : Total 6 Click Score: 8    End of Session Equipment Utilized During Treatment: Left knee immobilizer Activity Tolerance: Patient tolerated treatment well Patient left: in bed;with call bell/phone within reach Nurse Communication: Mobility  status PT Visit Diagnosis: Muscle weakness (generalized) (M62.81);Difficulty in walking, not elsewhere classified (R26.2);Pain Pain - Right/Left: Left Pain - part of body: Knee     Time: 4010-2725 PT Time Calculation (min) (ACUTE ONLY): 25 min  Charges:  $Gait Training: 23-37 mins                     Mauro Kaufmann PT Acute Rehabilitation Services Pager [phone] Office [phone]    Franklin Lucas 07/16/2018, 1:20 PM

## 2018-07-16 NOTE — Progress Notes (Signed)
     Subjective: 4 Days Post-Op Procedure(s) (LRB): Bilateral total knee arthroplasty (Bilateral)   Patient reports pain as moderate, controlled with medication. No reported events throughout the night.  Working well with PT this morning. Still planning on CIR once approved.   Objective:   VITALS:   Vitals:   07/15/18 2159 07/16/18 0544  BP: 101/65 (!) 100/58  Pulse: (!) 101 95  Resp: 16 16  Temp: 98.7 F (37.1 C) 98.7 F (37.1 C)  SpO2: 92% 91%   Bilaterally: Dorsiflexion/Plantar flexion intact Incision: scant drainage No cellulitis present Compartment soft  LABS Recent Labs    07/14/18 0534 07/15/18 0337 07/16/18 0439  HGB 10.3* 9.8* 8.6*  HCT 32.5* 30.6* 26.7*  WBC 12.7* 11.9* 8.9  PLT 185 177 174    Recent Labs    07/14/18 0534 07/15/18 0337  NA 140 136  K 3.9 4.0  BUN 12 14  CREATININE 0.77 0.87  GLUCOSE 112* 118*     Assessment/Plan: 4 Days Post-Op Procedure(s) (LRB): Bilateral total knee arthroplasty (Bilateral) Dressings to be changed by the nurse after PT Up with therapy Discharge to CIR once approved   Anastasio Auerbach. Mehki Klumpp   PAC  07/16/2018, 8:25 AM

## 2018-07-17 ENCOUNTER — Other Ambulatory Visit: Payer: Self-pay

## 2018-07-17 ENCOUNTER — Inpatient Hospital Stay (HOSPITAL_COMMUNITY)
Admission: RE | Admit: 2018-07-17 | Discharge: 2018-07-25 | DRG: 560 | Disposition: A | Discharge status: Home / Self Care | Payer: 59 | Source: Intra-hospital | Attending: Physical Medicine & Rehabilitation | Admitting: Physical Medicine & Rehabilitation

## 2018-07-17 ENCOUNTER — Encounter (HOSPITAL_COMMUNITY): Payer: Self-pay | Admitting: Physical Medicine & Rehabilitation

## 2018-07-17 DIAGNOSIS — E785 Hyperlipidemia, unspecified: Secondary | ICD-10-CM | POA: Diagnosis present

## 2018-07-17 DIAGNOSIS — R609 Edema, unspecified: Secondary | ICD-10-CM | POA: Diagnosis not present

## 2018-07-17 DIAGNOSIS — K5900 Constipation, unspecified: Secondary | ICD-10-CM | POA: Diagnosis present

## 2018-07-17 DIAGNOSIS — G8918 Other acute postprocedural pain: Secondary | ICD-10-CM

## 2018-07-17 DIAGNOSIS — Z96653 Presence of artificial knee joint, bilateral: Secondary | ICD-10-CM | POA: Diagnosis present

## 2018-07-17 DIAGNOSIS — Z87891 Personal history of nicotine dependence: Secondary | ICD-10-CM

## 2018-07-17 DIAGNOSIS — M170 Bilateral primary osteoarthritis of knee: Principal | ICD-10-CM

## 2018-07-17 DIAGNOSIS — M7989 Other specified soft tissue disorders: Secondary | ICD-10-CM | POA: Diagnosis not present

## 2018-07-17 DIAGNOSIS — Z4789 Encounter for other orthopedic aftercare: Secondary | ICD-10-CM | POA: Diagnosis present

## 2018-07-17 DIAGNOSIS — D62 Acute posthemorrhagic anemia: Secondary | ICD-10-CM | POA: Diagnosis not present

## 2018-07-17 DIAGNOSIS — K5903 Drug induced constipation: Secondary | ICD-10-CM

## 2018-07-17 DIAGNOSIS — Z79899 Other long term (current) drug therapy: Secondary | ICD-10-CM

## 2018-07-17 DIAGNOSIS — Z96659 Presence of unspecified artificial knee joint: Secondary | ICD-10-CM

## 2018-07-17 DIAGNOSIS — Z9889 Other specified postprocedural states: Secondary | ICD-10-CM | POA: Diagnosis not present

## 2018-07-17 HISTORY — DX: Presence of unspecified artificial knee joint: Z96.659

## 2018-07-17 LAB — PROTIME-INR
INR: 1.07
Prothrombin Time: 13.8 seconds (ref 11.4–15.2)

## 2018-07-17 MED ORDER — FLEET ENEMA 7-19 GM/118ML RE ENEM
1.0000 | ENEMA | Freq: Every day | RECTAL | Status: DC | PRN
  Administered 2018-07-18: 1 via RECTAL
  Filled 2018-07-17: qty 1

## 2018-07-17 MED ORDER — RIVAROXABAN 10 MG PO TABS
10.0000 mg | ORAL_TABLET | Freq: Every day | ORAL | Status: DC
  Administered 2018-07-18 – 2018-07-25 (×8): 10 mg via ORAL
  Filled 2018-07-17 (×9): qty 1

## 2018-07-17 MED ORDER — METHOCARBAMOL 500 MG PO TABS
500.0000 mg | ORAL_TABLET | Freq: Four times a day (QID) | ORAL | Status: DC | PRN
  Administered 2018-07-17 – 2018-07-19 (×3): 500 mg via ORAL
  Filled 2018-07-17 (×5): qty 1

## 2018-07-17 MED ORDER — OXYCODONE HCL 5 MG PO TABS
5.0000 mg | ORAL_TABLET | ORAL | Status: DC | PRN
  Administered 2018-07-17 – 2018-07-21 (×11): 10 mg via ORAL
  Administered 2018-07-23: 5 mg via ORAL
  Administered 2018-07-23: 10 mg via ORAL
  Filled 2018-07-17 (×2): qty 2
  Filled 2018-07-17: qty 1
  Filled 2018-07-17 (×12): qty 2

## 2018-07-17 MED ORDER — TRAMADOL HCL 50 MG PO TABS
50.0000 mg | ORAL_TABLET | Freq: Four times a day (QID) | ORAL | Status: DC | PRN
  Administered 2018-07-18 – 2018-07-22 (×15): 100 mg via ORAL
  Administered 2018-07-23: 50 mg via ORAL
  Administered 2018-07-23 (×2): 100 mg via ORAL
  Administered 2018-07-23: 50 mg via ORAL
  Administered 2018-07-24 (×2): 100 mg via ORAL
  Administered 2018-07-24 (×2): 50 mg via ORAL
  Administered 2018-07-25 (×2): 100 mg via ORAL
  Filled 2018-07-17 (×19): qty 2
  Filled 2018-07-17: qty 1
  Filled 2018-07-17 (×4): qty 2
  Filled 2018-07-17: qty 1
  Filled 2018-07-17 (×2): qty 2

## 2018-07-17 MED ORDER — SENNOSIDES-DOCUSATE SODIUM 8.6-50 MG PO TABS: 1.0000 | ORAL_TABLET | Freq: Every day | ORAL | Status: DC

## 2018-07-17 MED ORDER — ONDANSETRON HCL 4 MG/2ML IJ SOLN: 4.0000 mg | Freq: Four times a day (QID) | INTRAMUSCULAR | Status: DC | PRN

## 2018-07-17 MED ORDER — BISACODYL 10 MG RE SUPP
10.0000 mg | Freq: Every day | RECTAL | Status: DC | PRN
  Administered 2018-07-17: 10 mg via RECTAL
  Filled 2018-07-17: qty 1

## 2018-07-17 MED ORDER — SORBITOL 70 % SOLN
30.0000 mL | Freq: Every day | Status: DC | PRN
  Administered 2018-07-17: 30 mL via ORAL
  Filled 2018-07-17: qty 30

## 2018-07-17 MED ORDER — ONDANSETRON HCL 4 MG PO TABS: 4.0000 mg | ORAL_TABLET | Freq: Four times a day (QID) | ORAL | Status: DC | PRN

## 2018-07-17 MED ORDER — SODIUM CHLORIDE 0.9 % IV SOLN
25.0000 mg | Freq: Four times a day (QID) | INTRAVENOUS | Status: DC | PRN
  Filled 2018-07-17: qty 1

## 2018-07-17 MED ORDER — HYDROCORTISONE 1 % EX CREA
TOPICAL_CREAM | Freq: Two times a day (BID) | CUTANEOUS | Status: DC
  Administered 2018-07-17 – 2018-07-23 (×11): via TOPICAL
  Administered 2018-07-24: 1 via TOPICAL
  Administered 2018-07-24 – 2018-07-25 (×2): via TOPICAL
  Filled 2018-07-17: qty 28

## 2018-07-17 MED ORDER — POLYETHYLENE GLYCOL 3350 17 G PO PACK: 17.0000 g | PACK | Freq: Every day | ORAL | Status: DC | PRN

## 2018-07-17 MED ORDER — METHOCARBAMOL 1000 MG/10ML IJ SOLN
500.0000 mg | Freq: Four times a day (QID) | INTRAVENOUS | Status: DC | PRN
  Filled 2018-07-17: qty 5

## 2018-07-17 NOTE — Progress Notes (Signed)
Inpatient Rehabilitation-Admissions Coordinator   Met with pt and his wife at the bedside this morning to continue discussion regarding CIR. Pt remains highly motivated for CIR and appears to be doing well since epidural pulled. AC communicated with pt that we are still waiting on insurance approval but will hopefully have a decision soon.   AC will follow up once insurance decision has been made.   Please call if questions.   Jhonnie Garner, OTR/L  Rehab Admissions Coordinator  [phone] 07/17/2018 10:35 AM

## 2018-07-17 NOTE — Progress Notes (Signed)
   Subjective: 5 Days Post-Op Procedure(s) (LRB): Bilateral total knee arthroplasty (Bilateral) Patient reports pain as moderate.   Patient seen in rounds by Dr. Lequita Halt. Patient is well, and has had no acute complaints or problems. Voiding without difficulty and positive flatus. Denies chest pain, SOB, or calf pain.  Objective: Vital signs in last 24 hours: Temp:  [98 F (36.7 C)-98.6 F (37 C)] 98 F (36.7 C) (11/04 0555) Pulse Rate:  [95-105] 95 (11/04 0555) Resp:  [16] 16 (11/04 0555) BP: (120-121)/(63-72) 121/72 (11/04 0555) SpO2:  [96 %-100 %] 100 % (11/04 0555)  Intake/Output from previous day:  Intake/Output Summary (Last 24 hours) at 07/17/2018 0747 Last data filed at 07/17/2018 0200 Gross per 24 hour  Intake 917.51 ml  Output 1300 ml  Net -382.49 ml    Labs: Recent Labs    07/15/18 0337 07/16/18 0439  HGB 9.8* 8.6*   Recent Labs    07/15/18 0337 07/16/18 0439  WBC 11.9* 8.9  RBC 3.24* 2.82*  HCT 30.6* 26.7*  PLT 177 174   Recent Labs    07/15/18 0337  NA 136  K 4.0  CL 103  CO2 26  BUN 14  CREATININE 0.87  GLUCOSE 118*  CALCIUM 8.0*   Recent Labs    07/16/18 0439 07/17/18 0424  INR 1.20 1.07    Exam: General - Patient is Alert and Oriented Extremity - Neurologically intact Neurovascular intact Sensation intact distally Dorsiflexion/Plantar flexion intact Dressing/Incision - clean, dry, no drainage Motor Function - intact, moving foot and toes well on exam.   Past Medical History:  Diagnosis Date  . Hemorrhoid 11/2016  . Hyperlipidemia   . Low back pain 02/22/2013  . OA (osteoarthritis)   . Syncope 06/16/2018    Assessment/Plan: 5 Days Post-Op Procedure(s) (LRB): Bilateral total knee arthroplasty (Bilateral) Principal Problem:   OA (osteoarthritis) of knee  Estimated body mass index is 32.97 kg/m as calculated from the following:   Height as of this encounter: 6\' 5"  (1.956 m).   Weight as of this encounter: 126.1 kg. Up  with therapy  DVT Prophylaxis - Xarelto Weight-bearing as tolerated  Awaiting approval for [facility].   Arther Abbott, PA-C Orthopedic Surgery 07/17/2018, 7:47 AM

## 2018-07-17 NOTE — Progress Notes (Signed)
Patient arrived to unit via care link with personal belongings. Place was oriented to room and call light is with-in reach. Patient tray was ordered.

## 2018-07-17 NOTE — Progress Notes (Signed)
Physical Therapy Treatment Patient Details Name: Franklin Lucas MRN: [id number] DOB: [birth year] Today's Date: 07/17/2018    History of Present Illness s/p bil TKA.  H/O syncope    PT Comments    POD # 5 am session Spouse present during session and very supportive.  Applied LEFT KI.  Gave pt 2 gait belt (one for each foot) and instructed how to self assist LE for reposition and lifting on/off bed.  Pt required MAX Assist  To transition from supine to EOB with assist B LE and upper body.  Allowed pt to sit EOB x 10 min to adjust to position change due to c/o nausea.  Assisted with standing by elevating bed to highest level.  Pt is 6'5''.  Assisted with amb in hallway using a Bariatric walker for increased support.  Pt required 3 standing rest breaks due to fatigue.  Excessive WBing through B UE's on walker.  Assisted to elevated BSC.  Pt c/o need to have a BM.  Allowed pt to sit on BSC x 12 min with no results.  Assisted off elevated BSC to transfer to elevated Bariatric recliner and positioned to comfort.   Pt will need aggressive Rehab at CIR.   Follow Up Recommendations  CIR     Equipment Recommendations  None recommended by PT    Recommendations for Other Services       Precautions / Restrictions Precautions Precautions: Fall;Knee Precaution Comments: wear L KI  Restrictions Weight Bearing Restrictions: No Other Position/Activity Restrictions: WBAT    Mobility  Bed Mobility Overal bed mobility: Needs Assistance Bed Mobility: Supine to Sit     Supine to sit: Mod assist     General bed mobility comments: assist to manage Bil LEs   also gave pt B gait belts placed around foot  to self assist legs off bed   Transfers Overall transfer level: Needs assistance Equipment used: Rolling walker (2 wheeled) Transfers: Sit to/from [employer] Sit to Stand: Mod assist Stand pivot transfers: Mod assist       General transfer comment: 50% VC's on proper tech, B LE  placement and hand placement.  Assisted off elevated bed.  Assisted onto and off BSC.  Assisted onto elevated recliner chair.    Ambulation/Gait Ambulation/Gait assistance: Min assist   Assistive device: Rolling walker (2 wheeled) Gait Pattern/deviations: Step-to pattern;Decreased step length - right;Decreased step length - left;Shuffle;Trunk flexed Gait velocity: decr   General Gait Details: Multiple standing rest breaks with cues for sequence, posture and position from RW.    Excessive WBing B UE's on walker.  Very unsteady with difficulty weight shifting, difficulty advancing either LE and increased c/o nausea with activity.     Stairs             Wheelchair Mobility    Modified Rankin (Stroke Patients Only)       Balance                                            Cognition Arousal/Alertness: Awake/alert Behavior During Therapy: WFL for tasks assessed/performed Overall Cognitive Status: Within Functional Limits for tasks assessed                                        Exercises  General Comments        Pertinent Vitals/Pain Pain Assessment: 0-10 Pain Score: 7  Pain Location: bil knees at session ends  Pain Descriptors / Indicators: Sore;Operative site guarding Pain Intervention(s): Monitored during session;Repositioned;Ice applied;Patient requesting pain meds-RN notified    Home Living                      Prior Function            PT Goals (current goals can now be found in the care plan section) Progress towards PT goals: Progressing toward goals    Frequency           PT Plan Current plan remains appropriate    Co-evaluation              AM-PAC PT "6 Clicks" Daily Activity  Outcome Measure  Difficulty turning over in bed (including adjusting bedclothes, sheets and blankets)?: Unable Difficulty moving from lying on back to sitting on the side of the bed? : Unable Difficulty sitting  down on and standing up from a chair with arms (e.g., wheelchair, bedside commode, etc,.)?: Unable Help needed moving to and from a bed to chair (including a wheelchair)?: Total Help needed walking in hospital room?: Total Help needed climbing 3-5 steps with a railing? : Total 6 Click Score: 6    End of Session Equipment Utilized During Treatment: Left knee immobilizer Activity Tolerance: Other (comment)(increased nausea) Patient left: in chair;with call bell/phone within reach;with family/visitor present;with nursing/sitter in room Nurse Communication: Mobility status;Patient requests pain meds;Other (comment)(request for [employer]) PT Visit Diagnosis: Muscle weakness (generalized) (M62.81);Difficulty in walking, not elsewhere classified (R26.2);Pain Pain - Right/Left: Left     Time: 4098-1191 PT Time Calculation (min) (ACUTE ONLY): 58 min  Charges:  $Gait Training: 23-37 mins $Therapeutic Activity: 23-37 mins                     Felecia Shelling  PTA Acute  Rehabilitation Services Pager      [phone] Office      [phone]

## 2018-07-17 NOTE — Progress Notes (Signed)
Physical Therapy Treatment Patient Details Name: Franklin Lucas MRN: [id number] DOB: 06-30-1956 Today's Date: 07/17/2018    History of Present Illness s/p bil TKA.  H/O syncope    PT Comments    POD # 5 pm session Assisted out of recliner to amb to bathroom for attempted BM.  Pt c/o ABD bloating and mild nausea.  Spouse present and also assisted.  Assisted onto elevated BSC that was placed in bathroom for privacy.  Instructed on safety with turns and tech to lower self standing to sit while spouse and I assisted with supporting B LE. Left pt in bathroom with spouse.   Follow Up Recommendations  CIR     Equipment Recommendations  None recommended by PT    Recommendations for Other Services       Precautions / Restrictions Precautions Precautions: Fall;Knee Precaution Comments: wear L KI  Restrictions Weight Bearing Restrictions: No Other Position/Activity Restrictions: WBAT    Mobility  Bed Mobility Overal bed mobility: Needs Assistance Bed Mobility: Supine to Sit     Supine to sit: Mod assist     General bed mobility comments: OOB in recliner   Transfers Overall transfer level: Needs assistance Equipment used: Rolling walker (2 wheeled) Transfers: Sit to/from [employer] Sit to Stand: Mod assist Stand pivot transfers: Mod assist       General transfer comment: 50% VC's on proper tech, B LE placement and hand placement.  Assisted off elevated bed.  Assisted onto and off BSC.  Assisted onto elevated recliner chair.    Ambulation/Gait Ambulation/Gait assistance: Min assist Gait Distance (Feet): 16 Feet Assistive device: Rolling walker (2 wheeled) Gait Pattern/deviations: Step-to pattern;Decreased step length - right;Decreased step length - left;Shuffle;Trunk flexed Gait velocity: decr   General Gait Details: amb to bathroom without B KI a limited distance.   Stairs             Wheelchair Mobility    Modified Rankin (Stroke Patients  Only)       Balance                                            Cognition Arousal/Alertness: Awake/alert Behavior During Therapy: WFL for tasks assessed/performed Overall Cognitive Status: Within Functional Limits for tasks assessed                                        Exercises      General Comments        Pertinent Vitals/Pain Pain Assessment: 0-10 Pain Score: 7  Pain Location: bil knees at session ends  Pain Descriptors / Indicators: Sore;Operative site guarding Pain Intervention(s): Monitored during session;Repositioned;Ice applied;Patient requesting pain meds-RN notified    Home Living                      Prior Function            PT Goals (current goals can now be found in the care plan section) Progress towards PT goals: Progressing toward goals    Frequency           PT Plan Current plan remains appropriate    Co-evaluation PT/OT/SLP Co-Evaluation/Treatment: Yes            AM-PAC PT "6 Clicks" Daily Activity  Outcome  Measure  Difficulty turning over in bed (including adjusting bedclothes, sheets and blankets)?: Unable Difficulty moving from lying on back to sitting on the side of the bed? : Unable Difficulty sitting down on and standing up from a chair with arms (e.g., wheelchair, bedside commode, etc,.)?: Unable Help needed moving to and from a bed to chair (including a wheelchair)?: Total Help needed walking in hospital room?: Total Help needed climbing 3-5 steps with a railing? : Total 6 Click Score: 6    End of Session Equipment Utilized During Treatment: Left knee immobilizer Activity Tolerance: Other (comment)(increased nausea) Patient left: in bathroom;with call bell/phone within reach;with family/visitor present Nurse Communication: Mobility status PT Visit Diagnosis: Muscle weakness (generalized) (M62.81);Difficulty in walking, not elsewhere classified (R26.2);Pain Pain - Right/Left:  Left     Time: 1610-9604 PT Time Calculation (min) (ACUTE ONLY): 20 min  Charges:  $Gait Training: 8-22 mins                     Felecia Shelling  PTA Acute  Rehabilitation Services Pager      [phone] Office      [phone]

## 2018-07-17 NOTE — H&P (Signed)
Physical Medicine and Rehabilitation Admission H&P    : HPI: Marris Frontera is a 62 year old right-handed male with history of hyperlipidemia and remote tobacco abuse.  Per report and patient, patient lives with spouse.  Independent prior to admission.One level home one entry step.  Presented 07/12/2018 with bilateral knee osteoarthritis and no relief with conservative care.  Underwent bilateral TKA 07/12/2018 per Dr. Despina Hick.  Hospital course pain management.  Weightbearing as tolerated bilateral lower extremities.  Acute blood loss anemia 8.6.  Maintained on Xarelto for DVT prophylaxis.  Pain management use of CPM as directed.  Therapy evaluations completed and patient was admitted for a comprehensive rehab program.  Please see pre-admission note as well.  Review of Systems  Constitutional: Negative for chills and fever.  HENT: Negative for hearing loss.   Eyes: Negative for blurred vision and double vision.  Respiratory: Negative for cough and shortness of breath.   Cardiovascular: Negative for chest pain, palpitations and leg swelling.  Gastrointestinal: Positive for constipation. Negative for nausea and vomiting.  Genitourinary: Negative for dysuria, flank pain and hematuria.  Musculoskeletal: Positive for joint pain and myalgias.  Skin: Negative for rash.  All other systems reviewed and are negative.  Past Medical History:  Diagnosis Date  . Hemorrhoid 11/2016  . Hyperlipidemia   . Low back pain 02/22/2013  . OA (osteoarthritis)   . Syncope 06/16/2018   Past Surgical History:  Procedure Laterality Date  . CARPAL TUNNEL RELEASE Right   . COLONOSCOPY  2011  . dental implants    . KNEE SURGERY Right 02/22/1997  . LT arm surgery- due to lacerations    . REFRACTIVE SURGERY Bilateral   . TOTAL KNEE ARTHROPLASTY Bilateral 07/12/2018   Procedure: Bilateral total knee arthroplasty;  Surgeon: Ollen Gross, MD;  Location: [facility];  Service: Orthopedics;  Laterality: Bilateral;     Family History  Problem Relation Age of Onset  . Cancer Mother        colon  . Stroke Sister   . Emphysema Maternal Grandfather   . Stroke Paternal Grandfather   . Alcohol abuse Paternal Grandfather    Social History:  reports that he has quit smoking. He has never used smokeless tobacco. He reports that he does not drink alcohol or use drugs. Allergies: No Known Allergies Medications Prior to Admission  Medication Sig Dispense Refill  . Artificial Tear Ointment (DRY EYES OP) Place 1 drop into both eyes 2 (two) times daily.    . Flaxseed, Linseed, (FLAX SEED OIL PO) Take 1 capsule by mouth daily.    Marland Kitchen glucosamine-chondroitin 500-400 MG tablet Take 1 tablet by mouth daily.    . Multiple Vitamins-Minerals (MULTIVITAMIN PO) Take 1 tablet by mouth daily.    . naproxen sodium (ALEVE) 220 MG tablet Take 440 mg by mouth daily as needed (for pain or headache).    . Omega-3 Fatty Acids (FISH OIL) 1000 MG CAPS Take 1,000 mg by mouth daily.    . Red Yeast Rice 600 MG CAPS Take 600 mg by mouth daily.    . vitamin C (ASCORBIC ACID) 500 MG tablet Take 500 mg by mouth daily.      Drug Regimen Review Drug regimen was reviewed and remains appropriate with no significant issues identified  Home: Home Living Family/patient expects to be discharged to:: Inpatient rehab Living Arrangements: Spouse/significant other Additional Comments: has walk in shower and standard commode   Functional History: Prior Function Level of Independence: Independent  Functional Status:  Mobility: Bed Mobility Overal bed mobility: Needs Assistance Bed Mobility: Supine to Sit Supine to sit: Mod assist Sit to supine: Mod assist General bed mobility comments: assist to manage Bil LEs   also gave pt B gait belts placed around foot  to self assist legs off bed  Transfers Overall transfer level: Needs assistance Equipment used: Rolling walker (2 wheeled) Transfers: Sit to/from Stand, Stand Pivot  Transfers Sit to Stand: Mod assist Stand pivot transfers: Mod assist General transfer comment: 50% VC's on proper tech, B LE placement and hand placement.  Assisted off elevated bed.  Assisted onto and off BSC.  Assisted onto elevated recliner chair.   Ambulation/Gait Ambulation/Gait assistance: Min assist Gait Distance (Feet): 28 Feet(twice) Assistive device: Rolling walker (2 wheeled) Gait Pattern/deviations: Step-to pattern, Decreased step length - right, Decreased step length - left, Shuffle, Trunk flexed General Gait Details: Multiple standing rest breaks with cues for sequence, posture and position from RW.    Excessive WBing B UE's on walker.  Very unsteady with difficulty weight shifting, difficulty advancing either LE and increased c/o nausea with activity.   Gait velocity: decr    ADL: ADL Overall ADL's : Needs assistance/impaired Eating/Feeding: Independent Grooming: Set up Upper Body Bathing: Set up Lower Body Bathing: Maximal assistance Upper Body Dressing : Set up Lower Body Dressing: Total assistance Toilet Transfer: Moderate assistance, +2 for physical assistance, RW(simulated--high bed) General ADL Comments: worked on functional mobility in room.  Pt is 6'5"; anticipate he will need 3;1 raised on step platform to lift higher  Cognition: Cognition Overall Cognitive Status: Within Functional Limits for tasks assessed Orientation Level: Oriented X4, Oriented to person, Oriented to time, Oriented to place, Oriented to situation Cognition Arousal/Alertness: Awake/alert Behavior During Therapy: [facility] for tasks assessed/performed Overall Cognitive Status: Within Functional Limits for tasks assessed  Physical Exam: Blood pressure 136/84, pulse 100, temperature 98.1 F (36.7 C), resp. rate 16, height 6\' 5"  (1.956 m), weight 126.1 kg, SpO2 100 %. Physical Exam  Vitals reviewed. Constitutional: He is oriented to person, place, and time.  HENT:  Head: Normocephalic.   Eyes: EOM are normal. Right eye exhibits no discharge. Left eye exhibits no discharge.  Neck: Normal range of motion. Neck supple. No thyromegaly present.  Cardiovascular: Normal rate, regular rhythm and normal heart sounds.  Respiratory: Effort normal and breath sounds normal. No respiratory distress.  GI: Soft. Bowel sounds are normal. He exhibits no distension.  Neurological: He is alert and oriented to person, place, and time.  Skin:  Knee incisions clean and dry    Results for orders placed or performed during the hospital encounter of 07/12/18 (from the past 48 hour(s))  CBC     Status: Abnormal   Collection Time: 07/16/18  4:39 AM  Result Value Ref Range   WBC 8.9 4.0 - 10.5 K/uL   RBC 2.82 (L) 4.22 - 5.81 MIL/uL   Hemoglobin 8.6 (L) 13.0 - 17.0 g/dL   HCT 16.1 (L) 09.6 - 04.5 %   MCV 94.7 80.0 - 100.0 fL   MCH 30.5 26.0 - 34.0 pg   MCHC 32.2 30.0 - 36.0 g/dL   RDW 40.9 81.1 - 91.4 %   Platelets 174 150 - 400 K/uL   nRBC 0.0 0.0 - 0.2 %    Comment: Performed at [facility], [address], [place], [place] [address]  Protime-INR     Status: None   Collection Time: 07/16/18  4:39 AM  Result Value Ref Range  Prothrombin Time 15.1 11.4 - 15.2 seconds   INR 1.20     Comment: Performed at [facility], [address], [place], [place] [address]  Protime-INR     Status: None   Collection Time: 07/17/18  4:24 AM  Result Value Ref Range   Prothrombin Time 13.8 11.4 - 15.2 seconds   INR 1.07     Comment: Performed at [facility], [address], [place], [place] [address]   No results found.     Medical Problem List and Plan: 1.  Decreased functional mobility secondary to bilateral TKA 07/12/2018.  Weightbearing as tolerated 2.  DVT Prophylaxis/Anticoagulation: Xarelto.  Check vascular study 3. Pain Management: Oxycodone/Ultram and Robaxin as needed 4. Mood: Provide emotional support 5. Neuropsych: This  patient is capable of making decisions on his own behalf. 6. Skin/Wound Care: Routine skin checks 7. Fluids/Electrolytes/Nutrition: Routine in and outs with follow-up chemistries 8.  Acute blood loss anemia.  Follow-up CBC 9.  Constipation.  Laxative assistance    Post Admission Physician Evaluation: 1. Preadmission assessment reviewed and changes made below. 2. Functional deficits secondary  to osteoarthritis status post bilateral TKA. 3. Patient is admitted to receive collaborative, interdisciplinary care between the physiatrist, rehab nursing staff, and therapy team. 4. Patient's level of medical complexity and substantial therapy needs in context of that medical necessity cannot be provided at a lesser intensity of care such as a SNF. 5. Patient has experienced substantial functional loss from his/her baseline which was documented above under the "Functional History" and "Functional Status" headings.  Judging by the patient's diagnosis, physical exam, and functional history, the patient has potential for functional progress which will result in measurable gains while on inpatient rehab.  These gains will be of substantial and practical use upon discharge  in facilitating mobility and self-care at the household level. 6. Physiatrist will provide 24 hour management of medical needs as well as oversight of the therapy plan/treatment and provide guidance as appropriate regarding the interaction of the two. 7. 24 hour rehab nursing will assist with bowel management, safety, skin/wound care, disease management, pain management and patient education  and help integrate therapy concepts, techniques,education, etc. 8. PT will assess and treat for/with: Lower extremity strength, range of motion, stamina, balance, functional mobility, safety, adaptive techniques and equipment, wound care, coping skills, pain control, education. Goals are: Supervision. 9. OT will assess and treat for/with: ADL's, functional  mobility, safety, upper extremity strength, adaptive techniques and equipment, wound mgt, ego support, and community reintegration.   Goals are: Supervision. Therapy may proceed with showering this patient. 10. Case Management and Social Worker will assess and treat for psychological issues and discharge planning. 11. Team conference will be held weekly to assess progress toward goals and to determine barriers to discharge. 12. Patient will receive at least 3 hours of therapy per day at least 5 days per week. 13. ELOS: 9-13 days.       14. Prognosis:  excellent  I have personally performed a face to face diagnostic evaluation, including, but not limited to relevant history and physical exam findings, of this patient and developed relevant assessment and plan.  Additionally, I have reviewed and concur with the physician assistant's documentation above.  The patient's status has not changed. The original post admission physician evaluation remains appropriate, and any changes from the pre-admission screening or documentation from the acute chart are noted above.    Maryla Morrow, MD, ABPMR Mcarthur Rossetti Angiulli, PA-C 07/17/2018

## 2018-07-17 NOTE — Progress Notes (Signed)
Physical Therapy Treatment Patient Details Name: Franklin Lucas MRN: [id number] DOB: 23-Dec-1955 Today's Date: 07/17/2018    History of Present Illness s/p bil TKA.  H/O syncope    PT Comments    POD # 5 pm session part 2 Applied B KI's and assisted off elevated toilet to amb to bed apporx 12 feet.  Assisted to supine, removed B KI's and performed B LE heel slides using gait belt to self assist.  Performed 10 reps each LE.  R LE knee flex ROM approx 45 degrees and L knee flex AAROM approx 40 degrees.  Applied ICE.   Follow Up Recommendations  CIR     Equipment Recommendations  None recommended by PT    Recommendations for Other Services       Precautions / Restrictions Precautions Precautions: Fall;Knee Precaution Comments: wear L KI  Restrictions Weight Bearing Restrictions: No Other Position/Activity Restrictions: WBAT    Mobility  Bed Mobility Overal bed mobility: Needs Assistance Bed Mobility: Supine to Sit     Supine to sit: Mod assist     General bed mobility comments: OOB in recliner   Transfers Overall transfer level: Needs assistance Equipment used: Rolling walker (2 wheeled) Transfers: Sit to/from [employer] Sit to Stand: Mod assist Stand pivot transfers: Mod assist       General transfer comment: 50% VC's on proper tech, B LE placement and hand placement.  Assisted off elevated bed.  Assisted onto and off BSC.  Assisted onto elevated recliner chair.    Ambulation/Gait Ambulation/Gait assistance: Min assist Gait Distance (Feet): 16 Feet Assistive device: Rolling walker (2 wheeled) Gait Pattern/deviations: Step-to pattern;Decreased step length - right;Decreased step length - left;Shuffle;Trunk flexed Gait velocity: decr   General Gait Details: amb to bathroom without B KI a limited distance.   Stairs             Wheelchair Mobility    Modified Rankin (Stroke Patients Only)       Balance                                            Cognition Arousal/Alertness: Awake/alert Behavior During Therapy: WFL for tasks assessed/performed Overall Cognitive Status: Within Functional Limits for tasks assessed                                        Exercises      General Comments        Pertinent Vitals/Pain Pain Assessment: 0-10 Pain Score: 7  Pain Location: bil knees at session ends  Pain Descriptors / Indicators: Sore;Operative site guarding Pain Intervention(s): Monitored during session;Repositioned;Ice applied;Patient requesting pain meds-RN notified    Home Living                      Prior Function            PT Goals (current goals can now be found in the care plan section) Progress towards PT goals: Progressing toward goals    Frequency           PT Plan Current plan remains appropriate    Co-evaluation PT/OT/SLP Co-Evaluation/Treatment: Yes            AM-PAC PT "6 Clicks" Daily Activity  Outcome Measure  Difficulty turning over  in bed (including adjusting bedclothes, sheets and blankets)?: Unable Difficulty moving from lying on back to sitting on the side of the bed? : Unable Difficulty sitting down on and standing up from a chair with arms (e.g., wheelchair, bedside commode, etc,.)?: Unable Help needed moving to and from a bed to chair (including a wheelchair)?: Total Help needed walking in hospital room?: Total Help needed climbing 3-5 steps with a railing? : Total 6 Click Score: 6    End of Session Equipment Utilized During Treatment: Left knee immobilizer Activity Tolerance: Other (comment)(increased nausea) Patient left: in bed;with call bell/phone within reach;with family/visitor present PT Visit Diagnosis: Muscle weakness (generalized) (M62.81);Difficulty in walking, not elsewhere classified (R26.2);Pain Pain - Right/Left: Left     Time: 4098-1191 PT Time Calculation (min) (ACUTE ONLY): 25 min  Charges:  $Gait  Training: 8-22 mins $Therapeutic Exercise: 23-37 mins                     Felecia Shelling  PTA Acute  Rehabilitation Services Pager      [phone] Office      [phone]

## 2018-07-17 NOTE — Progress Notes (Signed)
Secondary Market PMR Admission Coordinator Pre-Admission Assessment  Patient: Franklin Lucas is an 62 y.o., male MRN: [id number] DOB: 1956/06/25 Height: _0  (195.6 cm) Weight: 126.1 kg  Insurance Information HMO:     PPO: Yes     PCP:      IPA:      80/20:      OTHER:  PRIMARY: [employer] [facility] Choice Plus COBRA)      Policy#: [id number]      Subscriber: patient CM Name: Sharee Pimple      Phone#: [phone]     Fax#:  Pre-Cert#: [id number]      Employer:  [employer]auf[employer] provided by [employer] Oiler at [facility] for admit date 07/17/18 with 7 days approved (clinical updates due 07/23/18) Benefits:  Phone #: NA     Name: [email or web address] Eff. Date: 03/13/18 (terminates currently on 08/12/18)     Deduct: individual-none; Family:$3,000 (met:$3,000)     Out of Pocket Max: Individual: none; Family: $6,000 (met: $6,000) includes deductible      Life Max: NA CIR: 80%/20%      SNF: 80%/20%; 60 day limit Outpatient: 80%; 20 PT, 20 OT, 20 ST    Co-Pay: 20% Home Health: 80%; 60 visit limit     Co-Pay: 20% DME: 80%     Co-Pay: 20% Providers:  SECONDARY:       Policy#:       Subscriber:  CM Name:       Phone#:      Fax#:  Pre-Cert#:       Employer:  Benefits:  Phone #:      Name:  Eff. Date:      Deduct:       Out of Pocket Max:       Life Max:  CIR:       SNF:  Outpatient:      Co-Pay:  Home Health:       Co-Pay:  DME:      Co-Pay:   Medicaid Application Date:       Case Manager:  Disability Application Date:       Case Worker:   Emergency Contact Information         Contact Information    Name Relation Home Work Mobile   Boatman,[name] Spouse [phone]  [phone]      Current Medical History  Patient Admitting Diagnosis: Osteoarthritis of bilateral knees, now s/p bilateral total knee arthroplasty History of Present Illness: Pt is a 63 yo M with past medical history of carpal tunnel, syncope, and right knee surgery. Pt has had onset of pain and functional disability in bilateral knees for with onset starting approx 10 years  ago with progressive worsening. He has undergone non-surgical conservative treatments for more than 12 weeks with no improvement.  Pt presented for bilateral total knee replacements on 07/12/18 due to severe degenerative joint disease of bilateral knees. Pt tolerated PT and OT evaluations with some dizziness reported during change in positions. Pt has progressed well with therapies and CIR has been recommended. Pt is to be admitted to CIR on 07/17/18.   Patient's medical record from [facility] has been reviewed by the rehabilitation admission coordinator and physician. )    Past Medical History      Past Medical History:  Diagnosis Date  . Hemorrhoid 11/2016  . Hyperlipidemia   . Low back pain 02/22/2013  . OA (osteoarthritis)   . Syncope 06/16/2018    Family History   family history includes Alcohol abuse in his paternal  grandfather; Cancer in his mother; Emphysema in his maternal grandfather; Stroke in his paternal grandfather and sister.  Prior Rehab/Hospitalizations Has the patient had major surgery during 100 days prior to admission? No               Current Medications  Current Facility-Administered Medications:  .  0.9 %  sodium chloride infusion, , Intravenous, Continuous, Gaynelle Arabian, MD, Stopped at 07/15/18 1729 .  bisacodyl (DULCOLAX) suppository 10 mg, 10 mg, Rectal, Daily PRN, Aluisio, Pilar Plate, MD .  chlorproMAZINE (THORAZINE) 25 mg in sodium chloride 0.9 % 25 mL IVPB, 25 mg, Intravenous, Q6H PRN, Nicholes Stairs, MD, Stopped at 07/15/18 1523 .  diphenhydrAMINE (BENADRYL) 12.5 MG/5ML elixir 12.5-25 mg, 12.5-25 mg, Oral, Q4H PRN, Gaynelle Arabian, MD, 25 mg at 07/13/18 0240 .  docusate sodium (COLACE) capsule 100 mg, 100 mg, Oral, BID, Aluisio, Frank, MD, 100 mg at 07/17/18 0953 .  hydrocortisone cream 1 %, , Topical, BID, Babish, Matthew, PA-C .  HYDROmorphone (DILAUDID) injection 0.5-1 mg, 0.5-1 mg, Intravenous, Q4H PRN, Gaynelle Arabian, MD, 1 mg  at 07/16/18 1115 .  menthol-cetylpyridinium (CEPACOL) lozenge 3 mg, 1 lozenge, Oral, PRN **OR** phenol (CHLORASEPTIC) mouth spray 1 spray, 1 spray, Mouth/Throat, PRN, Aluisio, Frank, MD .  methocarbamol (ROBAXIN) tablet 500 mg, 500 mg, Oral, Q6H PRN, 500 mg at 07/17/18 0618 **OR** methocarbamol (ROBAXIN) 500 mg in dextrose 5 % 50 mL IVPB, 500 mg, Intravenous, Q6H PRN, Gaynelle Arabian, MD, Stopped at 07/12/18 1430 .  metoCLOPramide (REGLAN) tablet 5-10 mg, 5-10 mg, Oral, Q8H PRN **OR** metoCLOPramide (REGLAN) injection 5-10 mg, 5-10 mg, Intravenous, Q8H PRN, Aluisio, Frank, MD .  ondansetron (ZOFRAN) tablet 4 mg, 4 mg, Oral, Q6H PRN, 4 mg at 07/17/18 1231 **OR** ondansetron (ZOFRAN) injection 4 mg, 4 mg, Intravenous, Q6H PRN, Aluisio, Frank, MD .  oxyCODONE (Oxy IR/ROXICODONE) immediate release tablet 10-15 mg, 10-15 mg, Oral, Q4H PRN, Gaynelle Arabian, MD, 15 mg at 07/17/18 1407 .  oxyCODONE (Oxy IR/ROXICODONE) immediate release tablet 5-10 mg, 5-10 mg, Oral, Q4H PRN, Gaynelle Arabian, MD, 10 mg at 07/16/18 0524 .  polyethylene glycol (MIRALAX / GLYCOLAX) packet 17 g, 17 g, Oral, Daily PRN, Gaynelle Arabian, MD, 17 g at 07/17/18 1227 .  rivaroxaban (XARELTO) tablet 10 mg, 10 mg, Oral, Daily, Aluisio, Frank, MD, 10 mg at 07/17/18 0953 .  senna-docusate (Senokot-S) tablet 1 tablet, 1 tablet, Oral, QHS, Angiulli, Lavon Paganini, PA-C .  sodium phosphate (FLEET) 7-19 GM/118ML enema 1 enema, 1 enema, Rectal, Once PRN, Aluisio, Frank, MD .  traMADol Veatrice Bourbon) tablet 50-100 mg, 50-100 mg, Oral, Q6H PRN, Gaynelle Arabian, MD, 100 mg at 07/16/18 1805  Patients Current Diet:      Diet Order                  Diet - low sodium heart healthy         Diet regular Room service appropriate? Yes; Fluid consistency: Thin  Diet effective now               Precautions / Restrictions Precautions Precautions: Fall, Knee Precaution Comments: wear L KI  Restrictions Weight Bearing Restrictions: No Other  Position/Activity Restrictions: WBAT   Has the patient had 2 or more falls or a fall with injury in the past year?No  Prior Activity Level Community (5-7x/wk): retired; active; drove; was having bilatearl knee pain that limited gait quality  Prior Functional Level Do you want Prior Function Level of Independence: Independent from other? Self Care: Did  the patient need help bathing, dressing, using the toilet or eating?  Independent  Indoor Mobility: Did the patient need assistance with walking from room to room (with or without device)? Independent  Stairs: Did the patient need assistance with internal or external stairs (with or without device)? Independent  Functional Cognition: Did the patient need help planning regular tasks such as shopping or remembering to take medications? Independent  Home Assistive Devices / Equipment Home Assistive Devices/Equipment: Eyeglasses  Prior Device Use: Indicate devices/aids used by the patient prior to current illness, exacerbation or injury? None of the above  Prior Functional Level   Prior Functional Level Current Functional Level  Bed Mobility Independent Mod A  Transfers Independent Min/Mod Ax 2  Mobility - Walk/Wheelchair  Independent Min A x2   Mobility - Ambulation/Gait  Indepenent Min A x2  Upper Body Dressing Independent Set up A  Lower Body Dressing Independent Max A  Grooming Independent Set up A  Eating/Drinking Independent Independent  Toilet Transfer  Independent Not assessed  Bladder Continence  continent continent   Bowel Management   continent  continent  Stair Climbing  Independent NT   Communication No difficulties No difficulties  Memory   WNL   Cooking/Meal Prep  Independent      Housework  Independent   Money Management  Independent   Driving   Independent     Special needs/care consideration BiPAP/CPAP: no CPM: Yes Continuous Drip IV: no Dialysis: no        Days: no Life Vest:  no Oxygen: no Special Bed: no Trach Size: no Wound Vac (area): no     Location: no Skin: blister to right posterior thigh, cervical and thoracis rash on back; bilateral knee incision              Bowel mgmt:Last BM: 07/11/18 Bladder mgmt: continent Diabetic mgmt: no  Previous Home Environment Living Arrangements: Spouse/significant other Home Care Services: No Additional Comments: has walk in shower and standard commode  Discharge Living Setting Plans for Discharge Living Setting: Patient's home, Lives with (comment)(wife) Type of Home at Discharge: House Discharge Home Layout: One level Discharge Home Access: Stairs to enter Entrance Stairs-Rails: None Entrance Stairs-Number of Steps: 1 step, then 2 steps Discharge Bathroom Shower/Tub: Walk-in shower Discharge Bathroom Toilet: Handicapped height Discharge Bathroom Accessibility: Yes How Accessible: Accessible via walker Does the patient have any problems obtaining your medications?: No(family can assist in getting meds if unable to drive)  Social/Family/Support Systems Patient Roles: Spouse, Parent Contact Information: wife is emergency contact: [name] Anticipated Caregiver: Maudie Mercury and daughter karen if needed Anticipated Caregiver's Contact Information: [name]: cell [phone]) karen: [phone]) Ability/Limitations of Caregiver: Wife: supervision/Min A; wife may need eye sx in near future; Dtr can do inconsistent assist if needed Caregiver Availability: 24/7 Discharge Plan Discussed with Primary Caregiver: Yes(with wife and pt) Is Caregiver In Agreement with Plan?: Yes Does Caregiver/Family have Issues with Lodging/Transportation while Pt is in Rehab?: No  Goals/Additional Needs Patient/Family Goal for Rehab: PT/OT: Supervision/MIn A; SLP: NA Expected length of stay: 10-14 days Cultural Considerations: [place] Dietary Needs: Regular, thin liquids  Equipment Needs: TBD Pt/Family Agrees to Admission and willing to  participate: Yes Program Orientation Provided & Reviewed with Pt/Caregiver Including Roles  & Responsibilities: Yes(reveiwed with pt and wife)  Barriers to Discharge: Decreased caregiver support, Home environment access/layout, Other (comments)  Barriers to Discharge Comments: wife may need to have eye surgery in near future (f/u appmt mid november)  Patient Condition: I have reviewed  medical records from [facility], spoken with RN, CM, and patient and spouse. I met with patient at the bedside and discussed via phone for inpatient rehabilitation assessment.  Patient will benefit from ongoing PT and OT, can actively participate in 3 hours of therapy a day 5 days of the week, and can make measurable gains during the admission.  Patient will also benefit from the coordinated team approach during an Inpatient Acute Rehabilitation admission.  The patient will receive intensive therapy as well as Rehabilitation physician, nursing, social worker, and care management interventions.  Due to bowel management, bladder management, safety, skin/wound care, disease management, medical administration, pain management, and patient education the patient requires 24 hour a day rehabilitation nursing.  The patient is currently Min/Mod A x2 with mobility and set up A for basic ADLs with Max A for LB ADLs.  Discharge setting and therapy post discharge at home with home health is anticipated.  Patient has agreed to participate in the Acute Inpatient Rehabilitation Program and will admit 07/17/18.  Preadmission Screen Completed By:  Jhonnie Garner, 07/17/2018 3:24 PM ______________________________________________________________________   Discussed status with Dr. Posey Pronto on 07/17/18 at 3:45PM and received telephone approval for admission today.  Admission Coordinator:  Jhonnie Garner, time 3:45PM/Date 07/17/18   Assessment/Plan: Diagnosis: Osteoarthritis of bilateral knees, now s/p bilateral total knee  arthroplasty  1. Does the need for close, 24 hr/day  Medical supervision in concert with the patient's rehab needs make it unreasonable for this patient to be served in a less intensive setting? Yes  2. Co-Morbidities requiring supervision/potential complications: carpal tunnel, syncope, right knee surgery 3. Due to bowel management, safety, skin/wound care, disease management, pain management and patient education, does the patient require 24 hr/day rehab nursing? Yes 4. Does the patient require coordinated care of a physician, rehab nurse, PT (1-2 hrs/day, 5 days/week) and OT (1-2 hrs/day, 5 days/week) to address physical and functional deficits in the context of the above medical diagnosis(es)? Yes Addressing deficits in the following areas: balance, endurance, locomotion, strength, transferring, bathing, dressing, toileting and psychosocial support 5. Can the patient actively participate in an intensive therapy program of at least 3 hrs of therapy 5 days a week? Yes 6. The potential for patient to make measurable gains while on inpatient rehab is excellent 7. Anticipated functional outcomes upon discharge from inpatients are: supervision PT, supervision and min assist OT, n/a SLP 8. Estimated rehab length of stay to reach the above functional goals is: 7-11 days. 9. Anticipated D/C setting: Home 10. Anticipated post D/C treatments: HH therapy and Home excercise program 11. Overall Rehab/Functional Prognosis: excellent    RECOMMENDATIONS: This patient's condition is appropriate for continued rehabilitative care in the following setting: CIR Patient has agreed to participate in recommended program. Yes Note that insurance prior authorization may be required for reimbursement for recommended care.  Comment:  Jhonnie Garner 07/17/2018  Delice Lesch, MD, ABPMR 07/17/18        Revision History    Date/Time User Provider Type Action  07/17/2018 3:57 PM Jamse Arn, MD Physician  Sign  07/17/2018 3:47 PM Jhonnie Garner, [place] [facility] Admission Coordinator Share  View Details Report

## 2018-07-17 NOTE — Progress Notes (Signed)
Report called to [facility] Inpatient Rehab RN.  Transport via [employer].

## 2018-07-17 NOTE — Progress Notes (Signed)
Inpatient Rehabilitation-Admissions Coordinator   [facility] received insurance authorization for CIR. Pt is to be admitted to CIR today. CM Rosalita Chessman is setting up [facility] for transport to CIR today. Pt and family aware.   Please call if questions.  Nanine Means, OTR/L  Rehab Admissions Coordinator  [phone] 07/17/2018 3:51 PM

## 2018-07-18 ENCOUNTER — Inpatient Hospital Stay (HOSPITAL_COMMUNITY): Payer: 59 | Admitting: *Deleted

## 2018-07-18 ENCOUNTER — Inpatient Hospital Stay (HOSPITAL_COMMUNITY): Payer: 59

## 2018-07-18 ENCOUNTER — Inpatient Hospital Stay (HOSPITAL_COMMUNITY): Payer: 59 | Admitting: Occupational Therapy

## 2018-07-18 ENCOUNTER — Inpatient Hospital Stay (HOSPITAL_COMMUNITY): Payer: 59 | Admitting: Physical Therapy

## 2018-07-18 DIAGNOSIS — Z96653 Presence of artificial knee joint, bilateral: Secondary | ICD-10-CM

## 2018-07-18 DIAGNOSIS — R609 Edema, unspecified: Secondary | ICD-10-CM

## 2018-07-18 DIAGNOSIS — Z9889 Other specified postprocedural states: Secondary | ICD-10-CM

## 2018-07-18 DIAGNOSIS — M7989 Other specified soft tissue disorders: Secondary | ICD-10-CM

## 2018-07-18 DIAGNOSIS — D62 Acute posthemorrhagic anemia: Secondary | ICD-10-CM

## 2018-07-18 LAB — CBC WITH DIFFERENTIAL/PLATELET
Abs Immature Granulocytes: 0.12 10*3/uL — ABNORMAL HIGH (ref 0.00–0.07)
BASOS PCT: 0 %
Basophils Absolute: 0 10*3/uL (ref 0.0–0.1)
Eosinophils Absolute: 0.4 10*3/uL (ref 0.0–0.5)
Eosinophils Relative: 4 %
HCT: 25.3 % — ABNORMAL LOW (ref 39.0–52.0)
Hemoglobin: 8.1 g/dL — ABNORMAL LOW (ref 13.0–17.0)
Immature Granulocytes: 1 %
Lymphocytes Relative: 15 %
Lymphs Abs: 1.4 10*3/uL (ref 0.7–4.0)
MCH: 29.2 pg (ref 26.0–34.0)
MCHC: 32 g/dL (ref 30.0–36.0)
MCV: 91.3 fL (ref 80.0–100.0)
MONO ABS: 1.5 10*3/uL — AB (ref 0.1–1.0)
Monocytes Relative: 15 %
Neutro Abs: 6.3 10*3/uL (ref 1.7–7.7)
Neutrophils Relative %: 65 %
Platelets: 248 10*3/uL (ref 150–400)
RBC: 2.77 MIL/uL — AB (ref 4.22–5.81)
RDW: 11.6 % (ref 11.5–15.5)
WBC: 9.7 10*3/uL (ref 4.0–10.5)
nRBC: 0 % (ref 0.0–0.2)

## 2018-07-18 LAB — COMPREHENSIVE METABOLIC PANEL
ALK PHOS: 67 U/L (ref 38–126)
ALT: 35 U/L (ref 0–44)
ANION GAP: 6 (ref 5–15)
AST: 40 U/L (ref 15–41)
Albumin: 2.4 g/dL — ABNORMAL LOW (ref 3.5–5.0)
BUN: 11 mg/dL (ref 6–20)
CHLORIDE: 100 mmol/L (ref 98–111)
CO2: 28 mmol/L (ref 22–32)
Calcium: 8.3 mg/dL — ABNORMAL LOW (ref 8.9–10.3)
Creatinine, Ser: 0.85 mg/dL (ref 0.61–1.24)
GLUCOSE: 107 mg/dL — AB (ref 70–99)
POTASSIUM: 4 mmol/L (ref 3.5–5.1)
SODIUM: 134 mmol/L — AB (ref 135–145)
TOTAL PROTEIN: 5.5 g/dL — AB (ref 6.5–8.1)
Total Bilirubin: 1.2 mg/dL (ref 0.3–1.2)

## 2018-07-18 MED ORDER — CALCIUM CARBONATE ANTACID 500 MG PO CHEW
1.0000 | CHEWABLE_TABLET | ORAL | Status: DC | PRN
  Administered 2018-07-18: 200 mg via ORAL
  Filled 2018-07-18: qty 1

## 2018-07-18 MED ORDER — PRO-STAT SUGAR FREE PO LIQD
30.0000 mL | Freq: Two times a day (BID) | ORAL | Status: DC
  Administered 2018-07-18 – 2018-07-23 (×9): 30 mL via ORAL
  Filled 2018-07-18 (×13): qty 30

## 2018-07-18 MED ORDER — TAB-A-VITE/IRON PO TABS
1.0000 | ORAL_TABLET | Freq: Every day | ORAL | Status: DC
  Administered 2018-07-18 – 2018-07-25 (×8): 1 via ORAL
  Filled 2018-07-18 (×8): qty 1

## 2018-07-18 MED ORDER — CHLORPROMAZINE HCL 25 MG PO TABS
25.0000 mg | ORAL_TABLET | Freq: Four times a day (QID) | ORAL | Status: DC | PRN
  Administered 2018-07-19: 25 mg via ORAL
  Filled 2018-07-18 (×2): qty 1

## 2018-07-18 NOTE — Care Management Note (Signed)
Inpatient Rehabilitation Center Individual Statement of Services  Patient Name:  Franklin Lucas  Date:  07/18/2018  Welcome to the Inpatient Rehabilitation Center.  Our goal is to provide you with an individualized program based on your diagnosis and situation, designed to meet your specific needs.  With this comprehensive rehabilitation program, you will be expected to participate in at least 3 hours of rehabilitation therapies Monday-Friday, with modified therapy programming on the weekends.  Your rehabilitation program will include the following services:  Physical Therapy (PT), Occupational Therapy (OT), 24 hour per day rehabilitation nursing, Case Management (Social Worker), Rehabilitation Medicine, Nutrition Services and Pharmacy Services  Weekly team conferences will be held on Wednesday to discuss your progress.  Your Social Worker will talk with you frequently to get your input and to update you on team discussions.  Team conferences with you and your family in attendance may also be held.  Expected length of stay: 7-10 days  Overall anticipated outcome: supervision level  Depending on your progress and recovery, your program may change. Your Social Worker will coordinate services and will keep you informed of any changes. Your Social Worker's name and contact numbers are listed  below.  The following services may also be recommended but are not provided by the Inpatient Rehabilitation Center:   Driving Evaluations  Home Health Rehabiltiation Services  Outpatient Rehabilitation Services  Vocational Rehabilitation   Arrangements will be made to provide these services after discharge if needed.  Arrangements include referral to agencies that provide these services.  Your insurance has been verified to be:  [facility] Your primary doctor is:  Janece Canterbury  Pertinent information will be shared with your doctor and your insurance company.  Social Worker:  Dossie Der, SW [phone] or [phone]  Information discussed with and copy given to patient by: Lucy Chris, 07/18/2018, 12:20 PM

## 2018-07-18 NOTE — Evaluation (Signed)
Occupational Therapy Assessment and Plan  Patient Details  Name: Franklin Lucas MRN: [id number] Date of Birth: [birth year]  OT Diagnosis: muscle weakness (generalized) Rehab Potential: Rehab Potential (ACUTE ONLY): Excellent ELOS: 9-11 days   Today's Date: 07/18/2018 OT Individual Time: 1245-8099 OT Individual Time Calculation (min): 76 min     Problem List:  Patient Active Problem List   Diagnosis Date Noted  . S/P TKR (total knee replacement) 07/17/2018  . S/p total knee replacement, bilateral 07/17/2018  . Drug induced constipation   . Acute blood loss anemia   . Postoperative pain   . OA (osteoarthritis) of knee 07/12/2018    Past Medical History:  Past Medical History:  Diagnosis Date  . Hemorrhoid 11/2016  . Hyperlipidemia   . Low back pain 02/22/2013  . OA (osteoarthritis)   . S/P TKR (total knee replacement) 07/17/2018  . Syncope 06/16/2018   Past Surgical History:  Past Surgical History:  Procedure Laterality Date  . CARPAL TUNNEL RELEASE Right   . COLONOSCOPY  2011  . dental implants    . KNEE SURGERY Right 02/22/1997  . LT arm surgery- due to lacerations    . REFRACTIVE SURGERY Bilateral   . TOTAL KNEE ARTHROPLASTY Bilateral 07/12/2018   Procedure: Bilateral total knee arthroplasty;  Surgeon: Gaynelle Arabian, MD;  Location: [facility];  Service: Orthopedics;  Laterality: Bilateral;  19mn    Assessment & Plan Clinical Impression: Patient is a 62y.o. year old right-handed male with past medical history of carpal tunnel, syncope, right knee surgery, hyperlipidemia and remote tobacco abuse.  Per report and patient, patient lives with spouse.  Independent prior to admission.One level home one entry step.  Presented 07/12/2018 with bilateral knee osteoarthritis and no relief with conservative care.  Underwent bilateral TKA 07/12/2018 per Dr. [name]  Hospital course pain management.  Weightbearing as tolerated bilateral lower extremities.  Acute blood loss anemia 8.6.   Maintained on Xarelto for DVT prophylaxis.  Pain management use of CPM as directed.  Therapy evaluations completed and patient was admitted for a comprehensive rehab program. Patient transferred to CIR on 07/17/2018 .    Patient currently requires CGA with UB ADL and maxA with LBbasic self-care skills secondary to muscle weakness, decreased cardiorespiratoy endurance and decreased standing balance and decreased balance strategies.  Prior to hospitalization, patient could complete ADLs and iADLs with independent .  Patient will benefit from skilled intervention to decrease level of assist with basic self-care skills and increase independence with basic self-care skills prior to discharge home with care partner.  Anticipate patient will require intermittent supervision and follow up therapies TBD.  OT - End of Session Activity Tolerance: Tolerates 10 - 20 min activity with multiple rests Endurance Deficit: Yes OT Assessment Rehab Potential (ACUTE ONLY): Excellent OT Patient demonstrates impairments in the following area(s): Balance;Safety;Endurance;Motor OT Basic ADL's Functional Problem(s): Grooming;Bathing;Dressing;Toileting OT Transfers Functional Problem(s): Toilet;Tub/Shower OT Additional Impairment(s): None OT Plan OT Intensity: Minimum of 1-2 x/day, 45 to 90 minutes OT Frequency: 5 out of 7 days OT Duration/Estimated Length of Stay: 9-11 days OT Treatment/Interventions: Balance/vestibular training;Discharge planning;Pain management;Self Care/advanced ADL retraining;Therapeutic Activities;Disease mangement/prevention;Functional mobility training;Patient/family education;Therapeutic Exercise;Community reintegration;DME/adaptive equipment instruction;UE/LE Strength taining/ROM;Wheelchair propulsion/positioning OT Self Feeding Anticipated Outcome(s): independent OT Basic Self-Care Anticipated Outcome(s): supervision OT Toileting Anticipated Outcome(s): supervision OT Bathroom Transfers  Anticipated Outcome(s): supervision OT Recommendation Patient destination: Home Follow Up Recommendations: Other (comment)(to be further assessed ) Equipment Recommended: To be determined Equipment Details: most likely [facility]  Skilled  Therapeutic Intervention OT eval completed with explanation of OT purpose, CIR process and overall POC. Appropriate DME obtained and issued for use during CIR stay given pt's height and wt including bariatric RW.   OT treatment session completed with focus on ADL retraining. Pt presents supine in bed pleasant and motivated to participate in therapy session, spouse present and supportive throughout session as well. Pt performing bed mobility this session with overall minA. Pt wishing to shower, completing bathing at shower level using elevated BSC positioned facing out of shower to increase safety with transfer completion. Pt requiring modA (+2 for safety) for sit<>stand from significantly elevated bed height to RW. Pt able to perform straight leg raise while supine in bed therefore did not use bil KI for room level mobility. Once in standing pt able to ambulate to [facility] in room shower with minA (+2 safety) using RW and with increased time/effort. Pt performed bathing seated on BSC, able to reach and wash lower portions of LEs with assist to wash feet and back. Pt washing buttocks using lateral leans this session due to safety concerns. Donned non-skid socks and hospital gown for transition back to EOB with modA +2 for sit<>stand and overall minA (+2 safety) for ambulating back to EOB. Pt fatigued with activity and requires seated rest break EOB. Pt requiring CGA for UB dressing, maxA for donning shorts/underwear - pt requiring modA+2 for sit<>stand due to fatigue with minA provided for standing balance while second person assists with advancing pants over hips. totalA for footwear this session. Pt returned to supine in bed with minA where he was left with bed  alarm set, call bell and needs within reach, spouse present.   OT Evaluation Precautions/Restrictions  Precautions Precautions: Fall;Knee Required Braces or Orthoses: Knee Immobilizer - Right;Knee Immobilizer - Left Knee Immobilizer - Right: Discontinue once straight leg raise with < 10 degree lag Knee Immobilizer - Left: Discontinue once straight leg raise with < 10 degree lag Restrictions Weight Bearing Restrictions: Yes RLE Weight Bearing: Weight bearing as tolerated LLE Weight Bearing: Weight bearing as tolerated General Chart Reviewed: Yes Family/Caregiver Present: Yes   Pain Pain Assessment Pain Scale: Faces Pain Score: 4  Faces Pain Scale: Hurts little more Pain Type: Acute pain;Surgical pain Pain Location: Knee Pain Orientation: Left;Right(L>R) Pain Descriptors / Indicators: Aching;Grimacing;Sore;Sharp Pain Frequency: Constant Pain Onset: With Activity Patients Stated Pain Goal: 0 Pain Intervention(s): Repositioned;Rest Multiple Pain Sites: No Home Living/Prior Functioning Home Living Available Help at Discharge: Family Type of Home: House Home Access: Stairs to enter Technical brewer of Steps: 1 Bathroom Shower/Tub: Multimedia programmer: Standard  Lives With: Spouse IADL History Homemaking Responsibilities: No Current License: Yes Mode of Transportation: Musician Occupation: Retired Type of Occupation: retired IT consultant  Leisure and Hobbies: golfing, reports was riding his motorcycle just prior to surgery, multiple grandkids IADL Comments: spouse can assist  Prior Function Level of Independence: Independent with basic ADLs, Independent with homemaking with ambulation, Independent with transfers, Independent with gait  Able to Take Stairs?: Yes Driving: Yes Vocation: Retired Leisure: Hobbies-yes (Comment) ADL ADL Equipment Provided: Other (comment)(may benefit from AE) Eating: Independent Grooming: Setup, Contact guard Where  Assessed-Grooming: Edge of bed Upper Body Bathing: Supervision/safety Where Assessed-Upper Body Bathing: Shower, Other (Comment)(seated in shower ) Lower Body Bathing: Minimal assistance Where Assessed-Lower Body Bathing: Shower, Other (Comment)(seated in shower ) Upper Body Dressing: Setup, Contact guard Where Assessed-Upper Body Dressing: Edge of bed Lower Body Dressing: Maximal assistance Where Assessed-Lower Body Dressing: Edge of bed  Toilet Transfer: Minimal assistance, Moderate assistance Toilet Transfer Method: Counselling psychologist: Bedside commode, Raised toilet seat(raised BSC ) Social research officer, government: Minimal assistance, Moderate assistance Social research officer, government Method: Heritage manager: Other (comment)(raised BSC in shower ) Vision Patient Visual Report: No change from baseline Vision Assessment?: No apparent visual deficits Perception  Perception: Within Functional Limits Praxis Praxis: Intact Cognition Overall Cognitive Status: Within Functional Limits for tasks assessed Arousal/Alertness: Awake/alert Orientation Level: Person;Place;Situation Person: Oriented Place: Oriented Situation: Oriented Year: 2019 Month: November Day of Week: Correct Memory: Appears intact Immediate Memory Recall: Sock;Blue;Bed Memory Recall: Sock;Blue;Bed Memory Recall Sock: Without Cue Memory Recall Blue: Without Cue Memory Recall Bed: Without Cue Attention: Divided Divided Attention: Appears intact Awareness: Appears intact Problem Solving: Appears intact Safety/Judgment: Appears intact Sensation Sensation Light Touch: Appears Intact Hot/Cold: Appears Intact Proprioception: Appears Intact Stereognosis: Appears Intact Coordination Gross Motor Movements are Fluid and Coordinated: Yes(UB intact ) Fine Motor Movements are Fluid and Coordinated: Yes Motor  Motor Motor - Skilled Clinical Observations: generalized weakness s/p surgery   Mobility  Bed Mobility Bed Mobility: Supine to Sit;Sit to Supine Supine to Sit: Contact Guard/Touching assist Sit to Supine: Minimal Assistance - Patient > 75% Transfers Sit to Stand: Moderate Assistance - Patient 50-74%;Other/comment Stand to Sit: Moderate Assistance - Patient 50-74%;Other/comment  Trunk/Postural Assessment  Cervical Assessment Cervical Assessment: Within Functional Limits Thoracic Assessment Thoracic Assessment: Within Functional Limits Lumbar Assessment Lumbar Assessment: Within Functional Limits Postural Control Postural Control: Deficits on evaluation(slower righting reactions)  Balance Balance Balance Assessed: Yes Static Sitting Balance Static Sitting - Balance Support: Feet supported;No upper extremity supported Static Sitting - Level of Assistance: 5: Stand by assistance Dynamic Sitting Balance Dynamic Sitting - Balance Support: Feet supported;During functional activity;No upper extremity supported Dynamic Sitting - Level of Assistance: 4: Min Insurance risk surveyor Standing - Balance Support: Bilateral upper extremity supported Static Standing - Level of Assistance: 4: Min assist Dynamic Standing Balance Dynamic Standing - Balance Support: Bilateral upper extremity supported;During functional activity Dynamic Standing - Level of Assistance: 3: Mod assist Extremity/Trunk Assessment RUE Assessment RUE Assessment: Within Functional Limits General Strength Comments: grossly 5/5 throughout LUE Assessment LUE Assessment: Exceptions to [facility] General Strength Comments: grossly 5/5; pt reports hx of L wrist surgery and unable to easily perform wrist flexion during transitional movements     Refer to Care Plan for Long Term Goals  Recommendations for other services: None    Discharge Criteria: Patient will be discharged from OT if patient refuses treatment 3 consecutive times without medical reason, if treatment goals not met, if there is  a change in medical status, if patient makes no progress towards goals or if patient is discharged from hospital.  The above assessment, treatment plan, treatment alternatives and goals were discussed and mutually agreed upon: by patient  Raymondo Band 07/18/2018, 9:56 AM

## 2018-07-18 NOTE — Plan of Care (Signed)
  Problem: Consults Goal: RH GENERAL PATIENT EDUCATION Description See Patient Education module for education specifics. Outcome: Progressing Goal: Skin Care Protocol Initiated - if Braden Score 18 or less Description If consults are not indicated, leave blank or document N/A Outcome: Progressing   Problem: RH BOWEL ELIMINATION Goal: RH STG MANAGE BOWEL WITH ASSISTANCE Description STG Manage Bowel with  Min Assistance.  Outcome: Progressing   Problem: RH BLADDER ELIMINATION Goal: RH STG MANAGE BLADDER WITH ASSISTANCE Description STG Manage Bladder With  Mod I   Outcome: Progressing Goal: RH STG MANAGE BLADDER WITH MEDICATION WITH ASSISTANCE Description STG           N/A,                    Manage Bladder With Medication With Assistance.  Outcome: Progressing   Problem: RH SKIN INTEGRITY Goal: RH STG SKIN FREE OF INFECTION/BREAKDOWN Description No new skin breakdown or infection at discharge  Outcome: Progressing Goal: RH STG MAINTAIN SKIN INTEGRITY WITH ASSISTANCE Description STG Maintain Skin Integrity With  Mod I   Outcome: Progressing Goal: RH STG ABLE TO PERFORM INCISION/WOUND CARE W/ASSISTANCE Description STG Able To Perform Incision/Wound Care With min  Assistance.  Outcome: Progressing   Problem: RH SAFETY Goal: RH STG ADHERE TO SAFETY PRECAUTIONS W/ASSISTANCE/DEVICE Description STG Adhere to Safety Precautions With  Min Assistance/Device.  Outcome: Progressing   Problem: RH PAIN MANAGEMENT Goal: RH STG PAIN MANAGED AT OR BELOW PT'S PAIN GOAL Description Less than 3 on 0-10 scale   Outcome: Progressing   Problem: RH KNOWLEDGE DEFICIT GENERAL Goal: RH STG INCREASE KNOWLEDGE OF SELF CARE AFTER HOSPITALIZATION Description Patient and wife will verbalize increased knowledge of self care prior to discharge.  Outcome: Progressing

## 2018-07-18 NOTE — Progress Notes (Signed)
Inpatient Rehabilitation  Patient information reviewed and entered into eRehab system by Joseguadalupe Stan M. Lue [place], M.A., CCC/SLP, PPS Coordinator.  Information including medical coding, functional ability and quality indicators will be reviewed and updated through discharge.    

## 2018-07-18 NOTE — Progress Notes (Signed)
Social Work  Social Work Assessment and Plan  Patient Details  Name: Franklin Lucas MRN: [id number] Date of Birth: Jun 18, 1956  Today's Date: 07/18/2018  Problem List:  Patient Active Problem List   Diagnosis Date Noted  . S/P TKR (total knee replacement) 07/17/2018  . S/p total knee replacement, bilateral 07/17/2018  . Drug induced constipation   . Acute blood loss anemia   . Postoperative pain   . OA (osteoarthritis) of knee 07/12/2018   Past Medical History:  Past Medical History:  Diagnosis Date  . Hemorrhoid 11/2016  . Hyperlipidemia   . Low back pain 02/22/2013  . OA (osteoarthritis)   . S/P TKR (total knee replacement) 07/17/2018  . Syncope 06/16/2018   Past Surgical History:  Past Surgical History:  Procedure Laterality Date  . CARPAL TUNNEL RELEASE Right   . COLONOSCOPY  2011  . dental implants    . KNEE SURGERY Right 02/22/1997  . LT arm surgery- due to lacerations    . REFRACTIVE SURGERY Bilateral   . TOTAL KNEE ARTHROPLASTY Bilateral 07/12/2018   Procedure: Bilateral total knee arthroplasty;  Surgeon: Ollen Gross, MD;  Location: [facility];  Service: Orthopedics;  Laterality: Bilateral;    Social History:  reports that he has quit smoking. He has never used smokeless tobacco. He reports that he does not drink alcohol or use drugs.  Family / Support Systems Marital Status: Married Patient Roles: Spouse, Parent Spouse/Significant Other: [name] [phone] Children: Karen-daughter [phone] Other Supports: Friends and church members Anticipated Caregiver: Wife and daughter Ability/Limitations of Caregiver: Wife had recent eye surgery and daughter works. Pt should be mod/i before going home Caregiver Availability: 24/7 Family Dynamics: Close knit family who rely upon one another. He has always been independent and plans to be upon discharge from here. He feels after today he is confident he will do well here, which is encouraging to him  Social  History Preferred language: English Religion:  Cultural Background: No issues Education: Automotive engineer educated Read: Yes Write: Yes Employment Status: Unemployed Fish farm manager Issues: No issues Guardian/Conservator: None-according to MD pt is capable of making his own decisions while here   Abuse/Neglect Abuse/Neglect Assessment Can Be Completed: Yes Physical Abuse: Denies Verbal Abuse: Denies Sexual Abuse: Denies Exploitation of patient/patient's resources: Denies Self-Neglect: Denies  Emotional Status Pt's affect, behavior adn adjustment status: Pt is motivated to do well and recover and get moving from his B-TKR's. He is glad he had them done together and now it is over and he just needs to recover from this. He voiced his wife is doing well but may need more eye surgery in the future. Recent Psychosocial Issues: healthy prior to admission Pyschiatric History: No issues deferred depression screen due to coping appropriately and open regarding his feelings about surgery Substance Abuse History: No issues  Patient / Family Perceptions, Expectations & Goals Pt/Family understanding of illness & functional limitations: Pt and wife can explain his knee replacements and now the goals is to heal and get moving. He is encouraged by the progress he has made already and confident he will do well here. He talks with the MD daily and feels his questions have been answered. Premorbid pt/family roles/activities: Husband, father, friend, church member, etc Anticipated changes in roles/activities/participation: resume Pt/family expectations/goals: Pt states: " I plan to be mobile before I leave here, I'm too big not to be." Wife states: " I hope he does well here, he should."  [employer]: None Premorbid Home Care/DME  Agencies: None Transportation available at discharge: Wife and daughter  Discharge Planning Living Arrangements: Spouse/significant  other Support Systems: Spouse/significant other, Children, Manufacturing engineer, Psychologist, clinical community Type of Residence: Private residence [employer] Resources: Media planner (specify)([employer]) Financial Resources: Family Support Financial Screen Referred: No Living Expenses: Own Money Management: Patient, Spouse Does the patient have any problems obtaining your medications?: No Home Management: Wife Patient/Family Preliminary Plans: Return home with wife who is able to be there but with her recent surgery she can not physically assist. He feels he will be mod/i before leaving here and be mobile. Their daughter is involved also. Sw Barriers to Discharge: Decreased caregiver support Sw Barriers to Discharge Comments: Wife recent eye surgery Social Work Anticipated Follow Up Needs: HH/OP  Clinical Impression Pleasant gentleman who is motivated to do well and become mobile before leaving here. His wife is supportive but limited due to her recent eye surgery. Their daughter is supportive but works. Will work on discharge needs, pt should do well and be mod/i by discharge.   Lucy Chris 07/18/2018, 12:38 PM

## 2018-07-18 NOTE — Progress Notes (Signed)
Physical Therapy Session Note  Patient Details  Name: Franklin Lucas MRN: [id number] Date of Birth: 1956-02-02  Today's Date: 07/18/2018 PT Individual Time: 1430-1540 PT Individual Time Calculation (min): 70 min   Short Term Goals: Week 1:  PT Short Term Goal 1 (Week 1): (P) STG = LTG due to short ELOS.  Skilled Therapeutic Interventions/Progress Updates: Pt presented in w/c with wife present agreeable to therapy. Pt stating currently 2/10 pain and scheduled to receive pain meds at 3:10p. Pt propelled w/c to ortho gym with supervision and intermittent verbal cues for straight trajectory. Participated in ambulated initially with B KI while ambulating on compliant surfaces. Pt required min verbal cues for safety with RW as pt would tend to pick up RW vs pushing. Pt then performed additional ambulation without B KI for approx 31f with RW and w/c follow. VC's to avoid taking excessively long steps and noted heavy use of BUE on RW. Upon returning back to mat noted one of pt's blisters on RLE draining although covered with Tegaderm. Pt was able to lay supine and turn to L with increased time and supervision to allow PTA to apply 4x4 gauze. Pt then participated in supine therex as follows: QS x 20, hip abd/add x 20, SLR x 10, SAQ, x 20, AA/AROM heel slides x8. Pt required cues for breathing and extended rests between bouts due to fatigue/pain. AROM checked after therex 75degrees bitlerally. KI reapplied and pt performed stand pivot mat to w/c with CGA from elevated mat. Pt transported back to room and requesting to remain in w/c. Pt left with call bell within reach and needs met. Nsg notified that pt was back in room and requesting pain meds.    Therapy Documentation Precautions:  Precautions Precautions: Fall, Knee Required Braces or Orthoses: Knee Immobilizer - Right, Knee Immobilizer - Left Knee Immobilizer - Right: Discontinue once straight leg raise with < 10 degree lag Knee Immobilizer - Left:  Discontinue once straight leg raise with < 10 degree lag Restrictions Weight Bearing Restrictions: Yes RLE Weight Bearing: Weight bearing as tolerated LLE Weight Bearing: Weight bearing as tolerated General: Chart Reviewed: Yes Additional Pertinent History: HLD, syncope Response to Previous Treatment: Patient with no complaints from previous session. Family/Caregiver Present: No Vital Signs: Therapy Vitals Pulse Rate: 90 Resp: 17 BP: 120/62 Patient Position (if appropriate): Sitting Oxygen Therapy SpO2: 96 % O2 Device: Room Air Pain:      Therapy/Group: Individual Therapy  Franklin Lucas  Franklin Lucas, PTA  07/18/2018, 4:16 PM

## 2018-07-18 NOTE — Progress Notes (Signed)
[facility] PHYSICAL MEDICINE & REHABILITATION PROGRESS NOTE   Subjective/Complaints:    Objective:   No results found. Recent Labs    07/16/18 0439  WBC 8.9  HGB 8.6*  HCT 26.7*  PLT 174   Recent Labs    07/18/18 0512  NA 134*  K 4.0  CL 100  CO2 28  GLUCOSE 107*  BUN 11  CREATININE 0.85  CALCIUM 8.3*    Intake/Output Summary (Last 24 hours) at 07/18/2018 0717 Last data filed at 07/18/2018 0412 Gross per 24 hour  Intake -  Output 2375 ml  Net -2375 ml     Physical Exam: Vital Signs Blood pressure 137/69, pulse 95, temperature 99.3 F (37.4 C), temperature source Oral, resp. rate 18, height 6\' 5"  (1.956 m), weight (!) 137.2 kg, SpO2 98 %.   General: No acute distress Mood and affect are appropriate Heart: Regular rate and rhythm no rubs murmurs or extra sounds Lungs: Clear to auscultation, breathing unlabored, no rales or wheezes Abdomen: Positive bowel sounds, soft nontender to palpation, nondistended Extremities: No clubbing, cyanosis, or edema Skin: No evidence of breakdown, no evidence of rash Neurologic: Cranial nerves II through XII intact, motor strength is 5/5 in bilateral deltoid, bicep, tricep, grip, 4- hip flexor, knee extensors,5 ankle dorsiflexor and plantar flexor Sensory exam normal sensation to light touch and proprioception in bilateral upper and lower extremities  Musculoskeletal: decreased knee flexion and ext , able to do SLR   Assessment/Plan: 1. Functional deficits secondary to Bilateral TKR which require 3+ hours per day of interdisciplinary therapy in a comprehensive inpatient rehab setting.  Physiatrist is providing close team supervision and 24 hour management of active medical problems listed below.  Physiatrist and rehab team continue to assess barriers to discharge/monitor patient progress toward functional and medical goals  Care Tool:  Bathing              Bathing assist       Upper Body  Dressing/Undressing Upper body dressing        Upper body assist      Lower Body Dressing/Undressing Lower body dressing            Lower body assist       Toileting Toileting    Toileting assist       Transfers Chair/bed transfer  Transfers assist           Locomotion Ambulation   Ambulation assist              Walk 10 feet activity   Assist           Walk 50 feet activity   Assist           Walk 150 feet activity   Assist           Walk 10 feet on uneven surface  activity   Assist           Wheelchair     Assist               Wheelchair 50 feet with 2 turns activity    Assist            Wheelchair 150 feet activity     Assist          Medical Problem List and Plan: 1.  Decreased functional mobility secondary to bilateral TKA 07/12/2018.  Weightbearing as tolerated PT, OT evals 2.  DVT Prophylaxis/Anticoagulation: Xarelto.  Check vascular study 3. Pain Management: Oxycodone/Ultram and  Robaxin as needed 4. Mood: Provide emotional support 5. Neuropsych: This patient is capable of making decisions on his own behalf. 6. Skin/Wound Care: Routine skin checks 7. Fluids/Electrolytes/Nutrition: Routine in and outs with follow-up chemistries Low alb will supplement 8.  Acute blood loss anemia.  Follow-up CBC 9.  Constipation.  Laxative assistance, GLycolax, sorbitol and dulcolax ordered- has used all of these and has had 3 BMs    LOS: 1 days A FACE TO FACE EVALUATION WAS PERFORMED  Erick Colace 07/18/2018, 7:17 AM

## 2018-07-18 NOTE — Evaluation (Addendum)
Physical Therapy Assessment and Plan  Patient Details  Name: Franklin Lucas MRN: [id number] Date of Birth: June 04, 1956  PT Diagnosis: Abnormality of gait, Difficulty walking and Muscle weakness Rehab Potential: Excellent ELOS: 7-10 days   Today's Date: 07/18/2018 PT Individual Time: 1047-1202 PT Individual Time Calculation (min): 75 min    Problem List:  Patient Active Problem List   Diagnosis Date Noted  . S/P TKR (total knee replacement) 07/17/2018  . S/p total knee replacement, bilateral 07/17/2018  . Drug induced constipation   . Acute blood loss anemia   . Postoperative pain   . OA (osteoarthritis) of knee 07/12/2018    Past Medical History:  Past Medical History:  Diagnosis Date  . Hemorrhoid 11/2016  . Hyperlipidemia   . Low back pain 02/22/2013  . OA (osteoarthritis)   . S/P TKR (total knee replacement) 07/17/2018  . Syncope 06/16/2018   Past Surgical History:  Past Surgical History:  Procedure Laterality Date  . CARPAL TUNNEL RELEASE Right   . COLONOSCOPY  2011  . dental implants    . KNEE SURGERY Right 02/22/1997  . LT arm surgery- due to lacerations    . REFRACTIVE SURGERY Bilateral   . TOTAL KNEE ARTHROPLASTY Bilateral 07/12/2018   Procedure: Bilateral total knee arthroplasty;  Surgeon: Gaynelle Arabian, MD;  Location: [facility];  Service: Orthopedics;  Laterality: Bilateral;  161mn    Assessment & Plan Clinical Impression: Patient is a 62y.o. year old male with history of hyperlipidemia and remote tobacco abuse.  Per report and patient, patient lives with spouse.  Independent prior to admission.One level home one entry step.  Presented 07/12/2018 with bilateral knee osteoarthritis and no relief with conservative care.  Underwent bilateral TKA 07/12/2018 per Dr. [name]  Hospital course pain management.  Weightbearing as tolerated bilateral lower extremities.  Acute blood loss anemia 8.6.  Maintained on Xarelto for DVT prophylaxis.  Pain management use of CPM as  directed.  Therapy evaluations completed and patient was admitted for a comprehensive rehab program.  Please see pre-admission note as well..  Patient transferred to CIR on 07/17/2018 .   Patient currently requires mod with mobility secondary to muscle weakness, decreased cardiorespiratoy endurance, and decreased standing balance and decreased balance strategies.  Prior to hospitalization, patient was independent  with mobility and lived with Spouse(Franklin Lucas) in a House home.  Home access is 1Stairs to enter.  Patient will benefit from skilled PT intervention to maximize safe functional mobility, minimize fall risk and decrease caregiver burden for planned discharge home with 24 hour supervision.  Anticipate patient will benefit from follow up OP at discharge.  PT - End of Session Activity Tolerance: Tolerates 30+ min activity with multiple rests Endurance Deficit: Yes Endurance Deficit Description: (2/2 generalized deconditioning following surgery) PT Assessment Rehab Potential (ACUTE/IP ONLY): Excellent PT Patient demonstrates impairments in the following area(s): Balance;Edema;Endurance;Motor PT Transfers Functional Problem(s): Bed Mobility;Bed to Chair;Furniture;Car PT Locomotion Functional Problem(s): Stairs;Wheelchair Mobility;Ambulation PT Plan PT Intensity: Minimum of 1-2 x/day ,45 to 90 minutes PT Frequency: 5 out of 7 days PT Duration Estimated Length of Stay: 7-10 days PT Treatment/Interventions: Ambulation/gait training;Discharge planning;DME/adaptive equipment instruction;Functional mobility training;Pain management;Psychosocial support;Therapeutic Activities;Splinting/orthotics;UE/LE Strength taining/ROM;Wheelchair propulsion/positioning;UE/LE Coordination activities;Therapeutic Exercise;Stair training;Skin care/wound management;Patient/family education;Neuromuscular re-education;Disease management/prevention;Community reintegration;Balance/vestibular training PT Transfers Anticipated  Outcome(s): supervision with LRAD PT Locomotion Anticipated Outcome(s): supervision with LRAD PT Recommendation Recommendations for Other Services: Therapeutic Recreation consult Therapeutic Recreation Interventions: KClinical cytogeneticistOuting/community reintergration Follow Up Recommendations: Outpatient PT Patient destination: Home Equipment Recommended: 3 in  1 bedside comode;Rolling walker with 5" wheels Equipment Details: (tall RW as pt is 6'5")  Skilled Therapeutic Intervention Patient received in bed & agreeable to tx. Therapist educated pt on ELOS, daily therapy schedule, weekly interdisciplinary team meeting, need for staff only to assist him out of bed or w/c, and other various CIR information. Pt able to complete 10 straight leg raises on BLE but with extensor lag R LE > L LE therefore therapist donned B KI's total assist for B knee stability in weight bearing. Pt completes functional mobility as outlined below. Pt requires max fade to mod assist for sit>stand transfers with cuing for hand placement to not pull on RW; pt with greatest difficulty transferring BUE from stable pushing surface to RW. Pt ambulates up to 100 ft with RW & min assist, negotiates 8 steps (3") with B rails and min assist, completes bed mobility in apartment with supervision and BUE assisting BLE. Pt did not completed car transfer 2/2 pt's height and difficulty flexing knees to transfer BLE in/out of car; educated pt on need to practice real car transfer prior to d/c & pt agreeable reporting he has a SUV. Educated pt on w/c brake management & pt propelled 100 ft with BUE & supervision. At end of session pt left in room with all needs in reach, set up with meal tray & LCSW (Franklin Lucas) in room.   PT Evaluation Precautions/Restrictions Precautions Precautions: Fall;Knee Required Braces or Orthoses: Knee Immobilizer - Right;Knee Immobilizer - Left Knee Immobilizer - Right: Discontinue once straight leg raise  with < 10 degree lag Knee Immobilizer - Left: Discontinue once straight leg raise with < 10 degree lag Restrictions Weight Bearing Restrictions: Yes RLE Weight Bearing: Weight bearing as tolerated LLE Weight Bearing: Weight bearing as tolerated   General Chart Reviewed: Yes Additional Pertinent History: HLD, syncope Response to Previous Treatment: Patient with no complaints from previous session. Family/Caregiver Present: No  Pain Denies pain at rest, pt reports it increased to 6/10 with activity. Rest breaks provided PRN and pt premedicated at beginning of session.   Home Living/Prior Functioning Home Living Living Arrangements: Spouse/significant other Available Help at Discharge: Family Type of Home: House Home Access: Stairs to enter Technical brewer of Steps: 1 Entrance Stairs-Rails: None Home Layout: One level Bathroom Shower/Tub: Walk-in shower(built in seat) Bathroom Toilet: Handicapped height  Lives With: Spouse(Franklin Lucas) Prior Function Level of Independence: Independent with basic ADLs;Independent with homemaking with ambulation;Independent with gait;Independent with transfers  Able to Take Stairs?: Yes Driving: Yes Vocation: Retired Leisure: Hobbies-yes (Comment) Comments: golfing, visiting grandkids  Vision/Perception  Pt reports he wears glasses at reading only at baseline.    Cognition Overall Cognitive Status: Within Functional Limits for tasks assessed Arousal/Alertness: Awake/alert Orientation Level: Oriented X4 Attention: Divided Divided Attention: Appears intact Memory: Appears intact Awareness: Appears intact Problem Solving: Appears intact Safety/Judgment: Appears intact  Sensation Sensation Light Touch: Not tested Proprioception: Not tested  Motor  Motor Motor - Skilled Clinical Observations: generalized weakness s/p surgery    Mobility Bed Mobility Bed Mobility: Rolling Right;Rolling Left;Supine to Sit;Sit to Supine(regular bed in  apartment) Rolling Right: Supervision/verbal cueing Rolling Left: Supervision/Verbal cueing Supine to Sit: Supervision/Verbal cueing Sit to Supine: Supervision/Verbal cueing Transfers Transfers: Sit to Stand Sit to Stand: Moderate Assistance - Patient 50-74% Sit to Stand Comment: difficulty transferring BUE from stable pushing surface to RW Stand to Sit: Minimal Assistance - Patient > 75% Stand to Sit Comment: elevated surface, +2 for safety  Transfer (Assistive device): Rolling walker  Locomotion  Gait Ambulation: Yes Gait Assistance: Minimal Assistance - Patient > 75% Gait Distance (Feet): 100 Feet Assistive device: Rolling walker Gait Gait: Yes Gait Pattern: (decreased weight shifting, decreased stride length, decreased step length BLE, decreased, minimal heel strike BLE) Gait velocity: decreased Stairs / Additional Locomotion Stairs: Yes Stairs Assistance: Minimal Assistance - Patient > 75% Stair Management Technique: Two rails Number of Stairs: 8 Height of Stairs: 3(inches) Product manager Mobility: Yes Wheelchair Assistance: Chartered loss adjuster: Both upper extremities Wheelchair Parts Management: Needs assistance Distance: 100 ft    Trunk/Postural Assessment  Cervical Assessment Cervical Assessment: Within Functional Limits Thoracic Assessment Thoracic Assessment: Within Functional Limits Lumbar Assessment Lumbar Assessment: Within Functional Limits Postural Control Postural Control: (delayed righting reactions)   Balance Balance Balance Assessed: Yes Dynamic Standing Balance Dynamic Standing - Balance Support: Bilateral upper extremity supported;During functional activity Dynamic Standing - Level of Assistance: 4: Min assist(during gait with RW)  Extremity Assessment  RUE Assessment RUE Assessment: Within Functional Limits LUE Assessment LUE Assessment: Within Functional Limits BLE: grossly 3+/5 as pt able to  weight bear with B KI's donned & no knee buckling noted. Did not test ROM, will do this as time allows.   Refer to Care Plan for Long Term Goals  Recommendations for other services: Therapeutic Recreation  Stress management and Outing/community reintegration  Discharge Criteria: Patient will be discharged from PT if patient refuses treatment 3 consecutive times without medical reason, if treatment goals not met, if there is a change in medical status, if patient makes no progress towards goals or if patient is discharged from hospital.  The above assessment, treatment plan, treatment alternatives and goals were discussed and mutually agreed upon: by patient  Waunita Schooner 07/18/2018, 3:38 PM

## 2018-07-18 NOTE — Progress Notes (Signed)
Preliminary notes--Bilateral lower extremities venous duplex exam completed. Negative for DVT. Hudsyn Champine H Jaysun Wessels(RDMS RVT) 07/18/18 10:33 AM

## 2018-07-19 ENCOUNTER — Inpatient Hospital Stay (HOSPITAL_COMMUNITY): Payer: 59 | Admitting: Physical Therapy

## 2018-07-19 ENCOUNTER — Telehealth: Payer: Self-pay

## 2018-07-19 ENCOUNTER — Inpatient Hospital Stay (HOSPITAL_COMMUNITY): Payer: 59 | Admitting: Occupational Therapy

## 2018-07-19 ENCOUNTER — Inpatient Hospital Stay (HOSPITAL_COMMUNITY): Payer: 59

## 2018-07-19 MED ORDER — POLYETHYLENE GLYCOL 3350 17 G PO PACK
17.0000 g | PACK | Freq: Every day | ORAL | Status: DC
  Administered 2018-07-19 – 2018-07-25 (×7): 17 g via ORAL
  Filled 2018-07-19 (×6): qty 1

## 2018-07-19 NOTE — Progress Notes (Signed)
Physical Therapy Session Note  Patient Details  Name: Franklin Lucas MRN: [id number] Date of Birth: Feb 23, 1956  Today's Date: 07/19/2018 PT Individual Time: 1030-1100 PT Individual Time Calculation (min): 30 min   Short Term Goals: Week 1:  PT Short Term Goal 1 (Week 1): (P) STG = LTG due to short ELOS.  Skilled Therapeutic Interventions/Progress Updates:   Pt seated in w/c ; wife present.  Seated AROM bil knees: 10 x 1 each heel slides with 30 second hold at end range flexion; 2 x 10 bil heel/toe raises with 2 second hold at end ranges, trunk flexion/extension, R/L long arc quad knee, R/L ankle pumps with partially extended knee supported by PT.  Pt resting in w/c when Ladona Ridgel, PT entered room for next session.      Therapy Documentation Precautions:  Precautions Precautions: Fall, Knee Required Braces or Orthoses: Knee Immobilizer - Right, Knee Immobilizer - Left Knee Immobilizer - Right: Discontinue once straight leg raise with < 10 degree lag Knee Immobilizer - Left: Discontinue once straight leg raise with < 10 degree lag Restrictions Weight Bearing Restrictions: Yes RLE Weight Bearing: Weight bearing as tolerated LLE Weight Bearing: Weight bearing as tolerated  Pain: Pain Assessment Pain Scale: 0-10 Pain Score: 3  Faces Pain Scale: Hurts a little bit Pain Type: Surgical pain Pain Location: Knee Pain Orientation: Right;Left Pain Descriptors / Indicators: Aching Pain Intervention(s): Medication (See eMAR)    Therapy/Group: Individual Therapy  Franklin Lucas 07/19/2018, 4:56 PM

## 2018-07-19 NOTE — Patient Care Conference (Signed)
Inpatient RehabilitationTeam Conference and Plan of Care Update Date: 07/19/2018   Time: 10:50 AM    Patient Name: Franklin Lucas      Medical Record Number: [id number]  Date of Birth: November 24, 1955 Sex: Male         Room/Bed: 4W08C/4W08C-01 Payor Info: Payor: Advertising copywriter / Plan: [employer] OTHER / Product Type: *No Product type* /    Admitting Diagnosis: B TKR  Admit Date/Time:  07/17/2018  4:34 PM Admission Comments: No comment available   Primary Diagnosis:  <principal problem not specified> Principal Problem: <principal problem not specified>  Patient Active Problem List   Diagnosis Date Noted  . S/P TKR (total knee replacement) 07/17/2018  . S/p total knee replacement, bilateral 07/17/2018  . Drug induced constipation   . Acute blood loss anemia   . Postoperative pain   . OA (osteoarthritis) of knee 07/12/2018    Expected Discharge Date: Expected Discharge Date: 07/26/18  Team Members Present: Physician leading conference: Dr. Claudette Laws Social Worker Present: Dossie Der, LCSW Nurse Present: Chana Bode, RN PT Present: Aleda Grana, PT OT Present: Rosalio Loud, OT SLP Present: Colin Benton, SLP PPS Coordinator present : Tora Duck, RN, CRRN     Current Status/Progress Goal Weekly Team Focus  Medical   Patient tolerating therapy, constipation improved, low hemoglobin, low protein level, wound healing well  Maintain medical stability  Evaluate anemia   Bowel/Bladder   Continent of B&B; LBM 07/18/18  Remain continent of B&B  Assess and toilet q shift and as needed   Swallow/Nutrition/ Hydration             ADL's   modA sit<>stand; CGA UB bathe/dress, mod-maxA LB ADL bathe/dress; minA functional transfers  supervision overall  functional transfers, ADL/self-care retraining, AE education, standing balance, endurance, strengthening, d/c planning, pt/family education    Mobility   mod assist sit<>stand, supervision bed mobility, min assist gait x  100 ft, supervision w/c mobility, 8 steps (3") with B rails and min assist  supervision overall with LRAD, mod I bed mobility  B Knee ROM, transfers, gait, stair negotiation, car transfer, pt education, d/c planning, w/c mobility   Communication             Safety/Cognition/ Behavioral Observations            Pain   Complains of bilateral Knee pain; prn Oxycodone and Tramadol for pain  Pain< or =2  Assess pain q shift and as needed   Skin   Bilateral surgical incision covered with steri strips to Knees; blisters on rt buttocks and back of right outer thigh; rash on upper back  Remain free of additional skin breakdown  Assess skin q shift and as needed      *See Care Plan and progress notes for long and short-term goals.     Barriers to Discharge  Current Status/Progress Possible Resolutions Date Resolved   Physician    Medical stability;Wound Care     Progressing towards goals  Continue rehab      Nursing                  PT                    OT                  SLP                SW Decreased caregiver support Wife recent eye surgery  Discharge Planning/Teaching Needs:  Home with wife who can assist with but limited due to eye surgery. Has been here for therapies with pt.      Team Discussion:  Goals supervision overall, currently min-mod assist level. Dopplers negative and dc immobolizers today. Pain being controlled. Anemic post op. Checking stools. Wife here for therapies today.  Revisions to Treatment Plan:  DC 11/13    Continued Need for Acute Rehabilitation Level of Care: The patient requires daily medical management by a physician with specialized training in physical medicine and rehabilitation for the following conditions: Daily direction of a multidisciplinary physical rehabilitation program to ensure safe treatment while eliciting the highest outcome that is of practical value to the patient.: Yes Daily medical management of patient stability for  increased activity during participation in an intensive rehabilitation regime.: Yes Daily analysis of laboratory values and/or radiology reports with any subsequent need for medication adjustment of medical intervention for : Post surgical problems   I attest that I was present, lead the team conference, and concur with the assessment and plan of the team.   Lucy Chris 07/19/2018, 1:21 PM

## 2018-07-19 NOTE — Progress Notes (Signed)
[place] PHYSICAL MEDICINE & REHABILITATION PROGRESS NOTE   Subjective/Complaints: Doppler results discussed, discussed Hgb, no dizziness, no blodd in stool noted  ROS- neg CP, SOB, N/V/D   Objective:   Vas [place] Lower Extremity Venous (dvt)  Result Date: 07/18/2018  Lower Venous Study Indications: Post-op, Edema, and Swelling.  Risk Factors: Surgery Bilateral TKA 07/12/2018. Limitations: Body habitus and bandages. Performing Technologist: Lorina Rabon  Examination Guidelines: A complete evaluation includes B-mode imaging, spectral Doppler, color Doppler, and power Doppler as needed of all accessible portions of each vessel. Bilateral testing is considered an integral part of a complete examination. Limited examinations for reoccurring indications may be performed as noted.  Right Venous Findings: +---------+---------------+---------+-----------+----------+-------+          CompressibilityPhasicitySpontaneityPropertiesSummary +---------+---------------+---------+-----------+----------+-------+ CFV      Full           Yes      Yes                          +---------+---------------+---------+-----------+----------+-------+ SFJ      Full                                                 +---------+---------------+---------+-----------+----------+-------+ FV Prox  Full                                                 +---------+---------------+---------+-----------+----------+-------+ FV Mid   Full                                                 +---------+---------------+---------+-----------+----------+-------+ FV DistalFull                                                 +---------+---------------+---------+-----------+----------+-------+ PFV      Full                                                 +---------+---------------+---------+-----------+----------+-------+ POP      Full           Yes      Yes                           +---------+---------------+---------+-----------+----------+-------+ PTV      Full                                                 +---------+---------------+---------+-----------+----------+-------+ PERO     Full                                                 +---------+---------------+---------+-----------+----------+-------+  Left Venous Findings: +---------+---------------+---------+-----------+----------+-------+          CompressibilityPhasicitySpontaneityPropertiesSummary +---------+---------------+---------+-----------+----------+-------+ CFV      Full           Yes      Yes                          +---------+---------------+---------+-----------+----------+-------+ SFJ      Full                                                 +---------+---------------+---------+-----------+----------+-------+ FV Prox  Full                                                 +---------+---------------+---------+-----------+----------+-------+ FV Mid   Full                                                 +---------+---------------+---------+-----------+----------+-------+ FV DistalFull                                                 +---------+---------------+---------+-----------+----------+-------+ PFV      Full                                                 +---------+---------------+---------+-----------+----------+-------+ POP      Full           Yes      Yes                          +---------+---------------+---------+-----------+----------+-------+ PTV      Full                                                 +---------+---------------+---------+-----------+----------+-------+ PERO     Full                                                 +---------+---------------+---------+-----------+----------+-------+    Summary: Right: There is no evidence of deep vein thrombosis in the lower extremity. No cystic structure found in the popliteal  fossa. Left: There is no evidence of deep vein thrombosis in the lower extremity. No cystic structure found in the popliteal fossa.  *See table(s) above for measurements and observations. Electronically signed by Harold Barban MD on 07/18/2018 at 6:55:50 PM.    Final    Recent Labs    07/18/18 0512  WBC 9.7  HGB 8.1*  HCT 25.3*  PLT 248   Recent Labs    07/18/18 0512  NA 134*  K 4.0  CL 100  CO2 28  GLUCOSE 107*  BUN 11  CREATININE 0.85  CALCIUM 8.3*    Intake/Output Summary (Last 24 hours) at 07/19/2018 0752 Last data filed at 07/19/2018 0408 Gross per 24 hour  Intake 420 ml  Output 2775 ml  Net -2355 ml     Physical Exam: Vital Signs Blood pressure 117/65, pulse 97, temperature 99.1 F (37.3 C), temperature source Oral, resp. rate 18, height 6' 5"  (1.956 m), weight (!) 137.2 kg, SpO2 100 %.   General: No acute distress Mood and affect are appropriate Heart: Regular rate and rhythm no rubs murmurs or extra sounds Lungs: Clear to auscultation, breathing unlabored, no rales or wheezes Abdomen: Positive bowel sounds, soft nontender to palpation, nondistended Extremities: No clubbing, cyanosis, or edema Skin: No evidence of breakdown, no evidence of rash, knee incision with gauze dressing Neurologic: Cranial nerves II through XII intact, motor strength is 5/5 in bilateral deltoid, bicep, tricep, grip, 4- hip flexor, knee extensors,5 ankle dorsiflexor and plantar flexor Sensory exam normal sensation to light touch and proprioception in bilateral upper and lower extremities  Musculoskeletal: decreased knee flexion and ext , able to do SLR bilaterallu   Assessment/Plan: 1. Functional deficits secondary to Bilateral TKR which require 3+ hours per day of interdisciplinary therapy in a comprehensive inpatient rehab setting.  Physiatrist is providing close team supervision and 24 hour management of active medical problems listed below.  Physiatrist and rehab team continue to  assess barriers to discharge/monitor patient progress toward functional and medical goals  Care Tool:  Bathing    Body parts bathed by patient: Right arm, Left arm, Chest, Abdomen, Front perineal area, Buttocks, Right upper leg, Left lower leg, Right lower leg, Left upper leg, Face         Bathing assist Assist Level: Minimal Assistance - Patient > 75%(via lateral/leans, anticipate will need increased assist when/if progressing to sit<>stand)     Upper Body Dressing/Undressing Upper body dressing   What is the patient wearing?: Pull over shirt    Upper body assist Assist Level: Contact Guard/Touching assist    Lower Body Dressing/Undressing Lower body dressing      What is the patient wearing?: Underwear/pull up, Pants     Lower body assist Assist for lower body dressing: 2 Helpers     Toileting Toileting    Toileting assist       Transfers Chair/bed transfer  Transfers assist  Chair/bed transfer activity did not occur: Safety/medical concerns  Chair/bed transfer assist level: Minimal Assistance - Patient > 75%     Locomotion Ambulation   Ambulation assist   Ambulation activity did not occur: Safety/medical concerns  Assist level: Minimal Assistance - Patient > 75%(chair follow) Assistive device: Walker-rolling Max distance: 75f   Walk 10 feet activity   Assist  Walk 10 feet activity did not occur: Safety/medical concerns  Assist level: Minimal Assistance - Patient > 75%     Walk 50 feet activity   Assist Walk 50 feet with 2 turns activity did not occur: Safety/medical concerns         Walk 150 feet activity   Assist Walk 150 feet activity did not occur: Safety/medical concerns         Walk 10 feet on uneven surface  activity   Assist Walk 10 feet on uneven surfaces activity did not occur: Safety/medical concerns   Assist level: Minimal Assistance - Patient > 75% Assistive device: WBrewing technologist  Assist Will patient use wheelchair at discharge?: Yes Type of Wheelchair: Manual    Wheelchair assist level: Supervision/Verbal cueing Max wheelchair distance: 100 ft     Wheelchair 50 feet with 2 turns activity    Assist        Assist Level: Supervision/Verbal cueing   Wheelchair 150 feet activity     Assist Wheelchair 150 feet activity did not occur: Safety/medical concerns        Medical Problem List and Plan: 1.  Decreased functional mobility secondary to bilateral TKA 07/12/2018.  Weightbearing as tolerated PT, OT - Team conference today please see physician documentation under team conference tab, met with team face-to-face to discuss problems,progress, and goals. Formulized individual treatment plan based on medical history, underlying problem and comorbidities. 2.  DVT Prophylaxis/Anticoagulation: Xarelto.  Negative vascular study 3. Pain Management: Oxycodone/Ultram and Robaxin as needed 4. Mood: Provide emotional support 5. Neuropsych: This patient is capable of making decisions on his own behalf. 6. Skin/Wound Care: Routine skin checks 7. Fluids/Electrolytes/Nutrition: Routine in and outs with follow-up chemistries Low alb will supplement 8.  Acute blood loss anemia.  Follow-up CBC, Hgb dropped  Again 8.1 check stool OB, recheck CBC in 2-3 d 9.  Constipation.  Laxative assistance, GLycolax, sorbitol and dulcolax ordered- has used all of these and has had 3 BMs    LOS: 2 days A FACE TO FACE EVALUATION WAS PERFORMED  Charlett Blake 07/19/2018, 7:52 AM

## 2018-07-19 NOTE — Telephone Encounter (Signed)
Per Dr.Acharya called to see how the pt is doing post knee replacement sx.  Lmom. No action needed, just called to get an update with the pt on his progress and recovery.

## 2018-07-19 NOTE — Progress Notes (Addendum)
Physical Therapy Session Note  Patient Details  Name: Franklin Lucas MRN: [id number] Date of Birth: August 09, 1956  Today's Date: 07/19/2018 PT Individual Time: 0805-0916 PT Individual Time Calculation (min): 71 min   Short Term Goals: Week 1:  PT Short Term Goal 1 (Week 1): (P) STG = LTG due to short ELOS.  Skilled Therapeutic Interventions/Progress Updates:  Pt received in bed with wife present for session. Pt transferred to EOB with supervision & wife assisted with donning B tennis shoes. Pt reports MD stated he did not need B KI's due to ability to perform BLE SLR (pt does demonstrate lag RLE>LLE) & B KI's not utilized during session. Pt ambulates short distance to w/c with RW & CGA & therapist transported pt to gym via w/c total assist for time management. Pt completes sit>stand with min assist with improving ability to transition BUE from stable pushing surface to RW. Pt ambulates 100 ft + 100 ft with RW & CGA with cuing for increased heel strike with pt able to return demonstrate as task progressed. No BLE knee buckling observed during session. Pt transferred supine onto mat table and performed BLE short arc quads (greater difficulty lifting RLE), AAROM heel slides (less pain with RLE movement), and quad sets (very little movement observed in BLE) with multimodal cuing for technique. Therapist educated pt & wife on need to promote B knee extension and to place pillows below calves when pt is supine in bed. Pt transferred mat>chair with RW & CGA. At end of session pt left sitting in w/c in room with wife present to supervise.   Pt reports 6-7/10 B knee pain with activity but reports he is premedicated & rest breaks taken PRN.  Pt reports BLE fatigue at end of session.  Therapy Documentation Precautions:  Precautions Precautions: Fall, Knee Required Braces or Orthoses: Knee Immobilizer - Right, Knee Immobilizer - Left Knee Immobilizer - Right: Discontinue once straight leg raise with < 10 degree  lag Knee Immobilizer - Left: Discontinue once straight leg raise with < 10 degree lag Restrictions Weight Bearing Restrictions: Yes RLE Weight Bearing: Weight bearing as tolerated LLE Weight Bearing: Weight bearing as tolerated    Therapy/Group: Individual Therapy  Sandi Mariscal 07/19/2018, 9:17 AM

## 2018-07-19 NOTE — Progress Notes (Signed)
Physical Therapy Session Note  Patient Details  Name: Vasilios Ottaway MRN: [id number] Date of Birth: [birth year]  Today's Date: 07/19/2018 PT Individual Time: 1100-1155 PT Individual Time Calculation (min): 55 min   Short Term Goals: Week 1:  PT Short Term Goal 1 (Week 1): (P) STG = LTG due to short ELOS.  Skilled Therapeutic Interventions/Progress Updates:    Pt received seated in w/c in room, agreeable to PT. No complaints of pain but reports tightness in B knees and that he just received pain medication from RN. Reviewed management of w/c parts and adjusted B elevating leg rests for improved patient comfort. Once leg rest management demonstrated pt is Supervision with v/c for management of w/c parts. Sit to stand x 5 reps from w/c to RW with min A, focus on BLE placement during transfer. Due to limited knee flexion ROM pt comes to mini-stand then pulls BLE under him before coming to full stand. Focus on increasing B knee extension in standing and decreased BUE support. Ambulation 2 x 20 ft with RW and CGA to min A for balance from w/c to Nustep. Nustep level 3 x 5 min, x 3 min with B UE/LE to increase B knee ROM to pt tolerance. Education with patient and his wife Selena Batten about BLE swelling and that elevation, TED hose, and ice as well as ROM can all help to decrease swelling. Pt left seated in w/c in room with needs in reach and wife present.  Therapy Documentation Precautions:  Precautions Precautions: Fall, Knee Required Braces or Orthoses: Knee Immobilizer - Right, Knee Immobilizer - Left Knee Immobilizer - Right: Discontinue once straight leg raise with < 10 degree lag Knee Immobilizer - Left: Discontinue once straight leg raise with < 10 degree lag Restrictions Weight Bearing Restrictions: Yes RLE Weight Bearing: Weight bearing as tolerated LLE Weight Bearing: Weight bearing as tolerated   Therapy/Group: Individual Therapy  Peter Congo, PT, DPT  07/19/2018, 11:57 AM

## 2018-07-19 NOTE — Progress Notes (Signed)
Social Work Patient ID: Franklin Lucas, male   DOB: Jul 30, 1956, 62 y.o.   MRN: [id number]  Met with pt and wife to discuss team conference goals of supervision level and target discharge date 11/13. Aware if progresses more quickly can move up discharge date. Wife went through therapies today with opt to see how he is doing. Both are in agreement with this plan.

## 2018-07-19 NOTE — Progress Notes (Signed)
Occupational Therapy Session Note  Patient Details  Name: Franklin Lucas MRN: [id number] Date of Birth: 1956-08-20  Today's Date: 07/19/2018 OT Individual Time: 4098-1191 OT Individual Time Calculation (min): 45 min    Short Term Goals: Week 1:  OT Short Term Goal 1 (Week 1): STG=LTG d/t ELOS OT Short Term Goal 2 (Week 1): Pt will perform sit<>stand with minA of one as precursor to functional task completion.  OT Short Term Goal 3 (Week 1): Pt will maintain standing balance during ADL task completion with CGA.   Skilled Therapeutic Interventions/Progress Updates:    Treatment session with focus on ADL retraining with functional transfers and sit > stand. Upon arrival pt reports need to toilet.  Pt completed sit > stand from w/c with mod assist and ambulated to [facility] over toilet with RW and min assist.  Pt pulled down shorts prior to toileting with min assist for standing balance.  Pt able to complete toileting hygiene with lateral leans.  Ambulated from toilet to [facility] in shower to engage in bathing.  Pt able to complete all bathing with lateral leans to wash buttocks, requiring only assistance to wash lower legs and feet.  Therapist educated on use of long handled sponge to increase independence with LB dressing.  Pt able to thread BLE through underwear and shorts,however required +2 to pull pants over hips due to decreased standing endurance and relying on BUE for standing post shower.  Pt ambulated back to w/c with RW and min assist.  Engaged in discussion regarding pt goals and concerns with discussion of bathing setup and recommendation for wide BSC due to height/weight and use of BSC in shower.  Pt asking about when he could shower in standing, educated on progression to standing with focus on increased dynamic standing during other therapy sessions and focus on progressing from BUE support on RW to one then to none to allow for increased participation in LB hygiene and dressing to prepare for bathing  in standing.  Pt reporting understanding.  Pt's wife present and supportive throughout session.  Therapy Documentation Precautions:  Precautions Precautions: Fall, Knee Required Braces or Orthoses: Knee Immobilizer - Right, Knee Immobilizer - Left Knee Immobilizer - Right: Discontinue once straight leg raise with < 10 degree lag Knee Immobilizer - Left: Discontinue once straight leg raise with < 10 degree lag Restrictions Weight Bearing Restrictions: Yes RLE Weight Bearing: Weight bearing as tolerated LLE Weight Bearing: Weight bearing as tolerated General:   Vital Signs: Therapy Vitals Temp: 98.2 F (36.8 C) Temp Source: Oral Pulse Rate: (!) 101 BP: 124/66 Patient Position (if appropriate): Lying Oxygen Therapy SpO2: 100 % O2 Device: Room Air Pain:  Pt with c/o pain 5/10 in bilateral knees.  Reports premedicated and attempting to wean from pain meds.     Therapy/Group: Individual Therapy  Rosalio Loud 07/19/2018, 2:01 PM

## 2018-07-20 ENCOUNTER — Inpatient Hospital Stay (HOSPITAL_COMMUNITY): Payer: 59 | Admitting: Occupational Therapy

## 2018-07-20 ENCOUNTER — Inpatient Hospital Stay (HOSPITAL_COMMUNITY): Payer: 59

## 2018-07-20 LAB — OCCULT BLOOD X 1 CARD TO LAB, STOOL: Fecal Occult Bld: POSITIVE — AB

## 2018-07-20 MED ORDER — PANTOPRAZOLE SODIUM 40 MG PO TBEC
40.0000 mg | DELAYED_RELEASE_TABLET | Freq: Every day | ORAL | Status: DC
  Administered 2018-07-20 – 2018-07-25 (×6): 40 mg via ORAL
  Filled 2018-07-20 (×6): qty 1

## 2018-07-20 NOTE — IPOC Note (Signed)
Overall Plan of Care [facility]) Patient Details Name: Franklin Lucas MRN: [id number] DOB: 26-Sep-1955  Admitting Diagnosis: <principal problem not specified>  Hospital Problems: Active Problems:   S/P TKR (total knee replacement)   S/p total knee replacement, bilateral     Functional Problem List: Nursing Bowel, Edema, Endurance, Medication Management, Motor, Pain, Skin Integrity  PT Balance, Edema, Endurance, Motor  OT Balance, Safety, Endurance, Motor  SLP    TR         Basic ADL's: OT Grooming, Bathing, Dressing, Toileting     Advanced  ADL's: OT       Transfers: PT Bed Mobility, Bed to Chair, Furniture, Customer service manager, Research scientist (life sciences): PT Stairs, Psychologist, prison and probation services, Ambulation     Additional Impairments: OT None  SLP        TR      Anticipated Outcomes Item Anticipated Outcome  Self Feeding independent  Swallowing      Basic self-care  supervision  Toileting  supervision   Bathroom Transfers supervision  Bowel/Bladder  min assist   Transfers  supervision with LRAD  Locomotion  supervision with LRAD  Communication     Cognition     Pain  less than 3 on  0-10 scale   Safety/Judgment  min assist    Therapy Plan: PT Intensity: Minimum of 1-2 x/day ,45 to 90 minutes PT Frequency: 5 out of 7 days PT Duration Estimated Length of Stay: 7-10 days OT Intensity: Minimum of 1-2 x/day, 45 to 90 minutes OT Frequency: 5 out of 7 days OT Duration/Estimated Length of Stay: 9-11 days      Team Interventions: Nursing Interventions Patient/Family Education, Bowel Management, Pain Management, Medication Management, Skin Care/Wound Management  PT interventions Ambulation/gait training, Discharge planning, DME/adaptive equipment instruction, Functional mobility training, Pain management, Psychosocial support, Therapeutic Activities, Splinting/orthotics, UE/LE Strength taining/ROM, Wheelchair propulsion/positioning, UE/LE Coordination activities,  Therapeutic Exercise, Stair training, Skin care/wound management, Patient/family education, Neuromuscular re-education, Disease management/prevention, Firefighter, Warden/ranger  OT Interventions Warden/ranger, Discharge planning, Pain management, Self Care/advanced ADL retraining, Therapeutic Activities, Disease mangement/prevention, Functional mobility training, Patient/family education, Therapeutic Exercise, Community reintegration, Fish farm manager, UE/LE Strength taining/ROM, Wheelchair propulsion/positioning  SLP Interventions    TR Interventions    SW/CM Interventions Discharge Planning, Psychosocial Support, Patient/Family Education   Barriers to Discharge MD  Medical stability  Nursing      PT      OT      SLP      SW Decreased caregiver support Wife recent eye surgery   Team Discharge Planning: Destination: PT-Home ,OT- Home , SLP-  Projected Follow-up: PT-Outpatient PT, OT-  Other (comment)(to be further assessed ), SLP-  Projected Equipment Needs: PT-3 in 1 bedside comode, Rolling walker with 5" wheels, OT- To be determined, SLP-  Equipment Details: PT-(tall RW as pt is 6'5"), OT-most likely will need bariatric BSC Patient/family involved in discharge planning: PT- Patient,  OT-Patient, SLP-   MD ELOS: 7-10d Medical Rehab Prognosis:  Excellent Assessment:   62 year old right-handed male with history of hyperlipidemia and remote tobacco abuse.  Per report and patient, patient lives with spouse.  Independent prior to admission.One level home one entry step.  Presented 07/12/2018 with bilateral knee osteoarthritis and no relief with conservative care.  Underwent bilateral TKA 07/12/2018 per Dr. Despina Hick.  Hospital course pain management.  Weightbearing as tolerated bilateral lower extremities.  Acute blood loss anemia 8.6.  Maintained on Xarelto for DVT prophylaxis.  Pain management use  of CPM as directed.    See Team  Conference Notes for weekly updates to the plan of care

## 2018-07-20 NOTE — Progress Notes (Signed)
Occupational Therapy Session Note  Patient Details  Name: Franklin Lucas MRN: [id number] Date of Birth: [birth year]  Today's Date: 07/20/2018 OT Individual Time: 1433-1530 OT Individual Time Calculation (min): 57 min    Short Term Goals: Week 1:  OT Short Term Goal 1 (Week 1): STG=LTG d/t ELOS OT Short Term Goal 2 (Week 1): Pt will perform sit<>stand with minA of one as precursor to functional task completion.  OT Short Term Goal 3 (Week 1): Pt will maintain standing balance during ADL task completion with CGA.   Skilled Therapeutic Interventions/Progress Updates:    Treatment session with focus on sit > stand, dynamic standing balance, and knee ROM.  Pt received supine in bed agreeable to therapy session.  Pt completed bed mobility with supervision with use of hospital bed functions, donned and tied shoes without assistance, and completed stand pivot transfer to w/c with RW with min guard.  Pt propelled w/c to Dayroom with min cues for straight trajectory.  Engaged in Wii bowling in standing with focus on standing balance progressing from one UE to no UE support, therapist providing min assist - contact guard for standing balance.  Sit <> stand close supervision throughout session.  Pt transferred to therapy mat with RW with min guard.  Engaged in heel slides on mat in supine to increase knee flexion/extension.  Returned to w/c min guard with RW and left upright in w/c in room with all needs in reach.  Therapy Documentation Precautions:  Precautions Precautions: Fall, Knee Required Braces or Orthoses: Knee Immobilizer - Right, Knee Immobilizer - Left Knee Immobilizer - Right: Discontinue once straight leg raise with < 10 degree lag Knee Immobilizer - Left: Discontinue once straight leg raise with < 10 degree lag Restrictions Weight Bearing Restrictions: Yes RLE Weight Bearing: Weight bearing as tolerated LLE Weight Bearing: Weight bearing as tolerated General:   Vital Signs: Therapy  Vitals Temp: 98 F (36.7 C) Pulse Rate: 94 Resp: 18 BP: (!) 142/71 Patient Position (if appropriate): Lying Oxygen Therapy SpO2: 98 % O2 Device: Room Air Pain:  Pt reports pain 4/10 in B knees. Premedicated.   Therapy/Group: Individual Therapy  Rosalio Loud 07/20/2018, 3:48 PM

## 2018-07-20 NOTE — Progress Notes (Signed)
[place] PHYSICAL MEDICINE & REHABILITATION PROGRESS NOTE   Subjective/Complaints:  No issues overnite had BM on toilet but stool sample not sent for OB  ROS- neg CP, SOB, N/V/D   Objective:   Vas [place] Lower Extremity Venous (dvt)  Result Date: 07/18/2018  Lower Venous Study Indications: Post-op, Edema, and Swelling.  Risk Factors: Surgery Bilateral TKA 07/12/2018. Limitations: Body habitus and bandages. Performing Technologist: Annamaria Helling  Examination Guidelines: A complete evaluation includes B-mode imaging, spectral Doppler, color Doppler, and power Doppler as needed of all accessible portions of each vessel. Bilateral testing is considered an integral part of a complete examination. Limited examinations for reoccurring indications may be performed as noted.  Right Venous Findings: +---------+---------------+---------+-----------+----------+-------+          CompressibilityPhasicitySpontaneityPropertiesSummary +---------+---------------+---------+-----------+----------+-------+ CFV      Full           Yes      Yes                          +---------+---------------+---------+-----------+----------+-------+ SFJ      Full                                                 +---------+---------------+---------+-----------+----------+-------+ FV Prox  Full                                                 +---------+---------------+---------+-----------+----------+-------+ FV Mid   Full                                                 +---------+---------------+---------+-----------+----------+-------+ FV DistalFull                                                 +---------+---------------+---------+-----------+----------+-------+ PFV      Full                                                 +---------+---------------+---------+-----------+----------+-------+ POP      Full           Yes      Yes                           +---------+---------------+---------+-----------+----------+-------+ PTV      Full                                                 +---------+---------------+---------+-----------+----------+-------+ PERO     Full                                                 +---------+---------------+---------+-----------+----------+-------+  Left Venous Findings: +---------+---------------+---------+-----------+----------+-------+          CompressibilityPhasicitySpontaneityPropertiesSummary +---------+---------------+---------+-----------+----------+-------+ CFV      Full           Yes      Yes                          +---------+---------------+---------+-----------+----------+-------+ SFJ      Full                                                 +---------+---------------+---------+-----------+----------+-------+ FV Prox  Full                                                 +---------+---------------+---------+-----------+----------+-------+ FV Mid   Full                                                 +---------+---------------+---------+-----------+----------+-------+ FV DistalFull                                                 +---------+---------------+---------+-----------+----------+-------+ PFV      Full                                                 +---------+---------------+---------+-----------+----------+-------+ POP      Full           Yes      Yes                          +---------+---------------+---------+-----------+----------+-------+ PTV      Full                                                 +---------+---------------+---------+-----------+----------+-------+ PERO     Full                                                 +---------+---------------+---------+-----------+----------+-------+    Summary: Right: There is no evidence of deep vein thrombosis in the lower extremity. No cystic structure found in the popliteal  fossa. Left: There is no evidence of deep vein thrombosis in the lower extremity. No cystic structure found in the popliteal fossa.  *See table(s) above for measurements and observations. Electronically signed by Harold Barban MD on 07/18/2018 at 6:55:50 PM.    Final    Recent Labs    07/18/18 0512  WBC 9.7  HGB 8.1*  HCT 25.3*  PLT 248   Recent Labs    07/18/18 0512  NA 134*  K 4.0  CL 100  CO2 28  GLUCOSE 107*  BUN 11  CREATININE 0.85  CALCIUM 8.3*    Intake/Output Summary (Last 24 hours) at 07/20/2018 0721 Last data filed at 07/20/2018 0459 Gross per 24 hour  Intake 660 ml  Output 1000 ml  Net -340 ml     Physical Exam: Vital Signs Blood pressure 137/63, pulse 93, temperature 97.7 F (36.5 C), resp. rate 20, height 6\' 5"  (1.956 m), weight 131.4 kg, SpO2 94 %.   General: No acute distress Mood and affect are appropriate Heart: Regular rate and rhythm no rubs murmurs or extra sounds Lungs: Clear to auscultation, breathing unlabored, no rales or wheezes Abdomen: Positive bowel sounds, soft nontender to palpation, nondistended Extremities: No clubbing, cyanosis, or edema Skin: No evidence of breakdown, no evidence of rash, knee incision with gauze dressing Neurologic: Cranial nerves II through XII intact, motor strength is 5/5 in bilateral deltoid, bicep, tricep, grip, 4- hip flexor, knee extensors,5 ankle dorsiflexor and plantar flexor Sensory exam normal sensation to light touch and proprioception in bilateral upper and lower extremities  Musculoskeletal: decreased knee flexion and ext , able to do SLR bilaterallu   Assessment/Plan: 1. Functional deficits secondary to Bilateral TKR which require 3+ hours per day of interdisciplinary therapy in a comprehensive inpatient rehab setting.  Physiatrist is providing close team supervision and 24 hour management of active medical problems listed below.  Physiatrist and rehab team continue to assess barriers to  discharge/monitor patient progress toward functional and medical goals  Care Tool:  Bathing    Body parts bathed by patient: Right arm, Left arm, Chest, Abdomen, Front perineal area, Buttocks, Right upper leg, Left upper leg, Face   Body parts bathed by helper: Left lower leg, Right lower leg     Bathing assist Assist Level: Minimal Assistance - Patient > 75%     Upper Body Dressing/Undressing Upper body dressing   What is the patient wearing?: Pull over shirt    Upper body assist Assist Level: Set up assist    Lower Body Dressing/Undressing Lower body dressing      What is the patient wearing?: Underwear/pull up, Pants     Lower body assist Assist for lower body dressing: 2 Helpers     Toileting Toileting    Toileting assist Assist for toileting: Moderate Assistance - Patient 50 - 74%     Transfers Chair/bed transfer  Transfers assist  Chair/bed transfer activity did not occur: Safety/medical concerns  Chair/bed transfer assist level: Minimal Assistance - Patient > 75%     Locomotion Ambulation   Ambulation assist   Ambulation activity did not occur: Safety/medical concerns  Assist level: Contact Guard/Touching assist Assistive device: Walker-rolling Max distance: 100 ft    Walk 10 feet activity   Assist  Walk 10 feet activity did not occur: Safety/medical concerns  Assist level: Contact Guard/Touching assist Assistive device: Walker-rolling   Walk 50 feet activity   Assist Walk 50 feet with 2 turns activity did not occur: Safety/medical concerns  Assist level: Contact Guard/Touching assist Assistive device: Walker-rolling    Walk 150 feet activity   Assist Walk 150 feet activity did not occur: Safety/medical concerns         Walk 10 feet on uneven surface  activity   Assist Walk 10 feet on uneven surfaces activity did not occur: Safety/medical concerns   Assist level: Minimal Assistance - Patient > 75% Assistive device:  Photographer  Will patient use wheelchair at discharge?: Yes Type of Wheelchair: Manual    Wheelchair assist level: Supervision/Verbal cueing Max wheelchair distance: 100 ft     Wheelchair 50 feet with 2 turns activity    Assist        Assist Level: Supervision/Verbal cueing   Wheelchair 150 feet activity     Assist Wheelchair 150 feet activity did not occur: Safety/medical concerns        Medical Problem List and Plan: 1.  Decreased functional mobility secondary to bilateral TKA 07/12/2018.  Weightbearing as tolerated PT, OT -ELOS 11/13 2.  DVT Prophylaxis/Anticoagulation: Xarelto.  Negative vascular study 3. Pain Management: Oxycodone/Ultram and Robaxin as needed 4. Mood: Provide emotional support 5. Neuropsych: This patient is capable of making decisions on his own behalf. 6. Skin/Wound Care: Routine skin checks, Skin tear Right lateral thigh 7. Fluids/Electrolytes/Nutrition: Routine in and outs with follow-up chemistries Low alb will supplement 8.  Acute blood loss anemia.  Follow-up CBC, Hgb dropped  Again 8.1 check stool OB, recheck CBC in am 9.  Constipation.  Laxative assistance, GLycolax, sorbitol and dulcolax ordered- has used all of these and has had 3 BMs    LOS: 3 days A FACE TO FACE EVALUATION WAS PERFORMED  Erick Colace 07/20/2018, 7:21 AM

## 2018-07-20 NOTE — Progress Notes (Signed)
Occupational Therapy Session Note  Patient Details  Name: Franklin Lucas MRN: [id number] Date of Birth: 09/17/1955  Today's Date: 07/20/2018 OT Individual Time: 4098-1191 OT Individual Time Calculation (min): 55 min    Short Term Goals: Week 1:  OT Short Term Goal 1 (Week 1): STG=LTG d/t ELOS OT Short Term Goal 2 (Week 1): Pt will perform sit<>stand with minA of one as precursor to functional task completion.  OT Short Term Goal 3 (Week 1): Pt will maintain standing balance during ADL task completion with CGA.   Skilled Therapeutic Interventions/Progress Updates:    Treatment session with focus on dynamic standing balance and knee flexion/extension during therapeutic activities.  Pt received upright in w/c willing to engage in therapy session.  Pt reports having taken shower in standing this AM with other OT as he reports thinking about what this therapist had discussed with him regarding progression.  Pt ambulated 175' with RW with contact guard, B knees flexed but knees with increased extension with increased distance.  Engaged in dynamic standing activity incorporating trunk rotation and reaching across midline and outside BOS with min assist when reaching outside BOS.  Engaged in seated foot slides to increase knee extension/flexion before returning to trunk rotation activity in standing with min guard- min assist.  Sit > stand from therapy mat with min guard throughout session.  Returned to w/c stand pivot with RW and left upright in w/c in room with all needs in reach.  Pt's wife present and providing encouragement and asking appropriate questions throughout.  Therapy Documentation Precautions:  Precautions Precautions: Fall, Knee Required Braces or Orthoses: Knee Immobilizer - Right, Knee Immobilizer - Left Knee Immobilizer - Right: Discontinue once straight leg raise with < 10 degree lag Knee Immobilizer - Left: Discontinue once straight leg raise with < 10 degree  lag Restrictions Weight Bearing Restrictions: Yes RLE Weight Bearing: Weight bearing as tolerated LLE Weight Bearing: Weight bearing as tolerated Pain: Pt reports pain 2/10 in B knees.  Described as a "tightness", but pain did not limit participation.   Therapy/Group: Individual Therapy  Franklin Lucas 07/20/2018, 12:27 PM

## 2018-07-20 NOTE — Progress Notes (Signed)
Occupational Therapy Session Note  Patient Details  Name: Franklin Lucas MRN: [id number] Date of Birth: 1956-08-13  Today's Date: 07/20/2018 OT Individual Time: 0830-0900 OT Individual Time Calculation (min): 30 min    Short Term Goals: Week 1:  OT Short Term Goal 1 (Week 1): STG=LTG d/t ELOS OT Short Term Goal 2 (Week 1): Pt will perform sit<>stand with minA of one as precursor to functional task completion.  OT Short Term Goal 3 (Week 1): Pt will maintain standing balance during ADL task completion with CGA.  Week 2:     Skilled Therapeutic Interventions/Progress Updates:    1:1 SElf care retraining at shower level. Pt able to come to EOB with supervision.Pt able to ambulate with RW with supervision without KI donned with cues for heels making contact with floor when stepping.  Advanced today with showering in standing. PT able to perform one limb stance to wash lower leg (just not feet) with one UE on grab bar for support with supervision. Pt dressed sit to stands on elevated seated surface. Pt did not required AE for LB dressing today and was able to tolerate increased knee flexion with donning shoes and tying them. Educated on icing knees 20 min on and off throughout the day. Pt making great progress.  Therapy Documentation Precautions:  Precautions Precautions: Fall, Knee Required Braces or Orthoses: Knee Immobilizer - Right, Knee Immobilizer - Left Knee Immobilizer - Right: Discontinue once straight leg raise with < 10 degree lag Knee Immobilizer - Left: Discontinue once straight leg raise with < 10 degree lag Restrictions Weight Bearing Restrictions: Yes RLE Weight Bearing: Weight bearing as tolerated LLE Weight Bearing: Weight bearing as tolerated General:   Vital Signs:   Pain: Pt reports feeling "better today than yesterday."  Did Apply ice at end of session   Therapy/Group: Individual Therapy  Roney Mans [facility] 07/20/2018, 1:49 PM

## 2018-07-20 NOTE — Progress Notes (Signed)
Physical Therapy Session Note  Patient Details  Name: Franklin Lucas MRN: [id number] Date of Birth: Nov 03, 1955  Today's Date: 07/20/2018 PT Individual Time: 4098-1191 PT Individual Time Calculation (min): 60 min   Short Term Goals: Week 1:  PT Short Term Goal 1 (Week 1): (P) STG = LTG due to short ELOS.  Skilled Therapeutic Interventions/Progress Updates:  Pt propelled wic on level tile with cues for efficiency for turns, 150'.  He managed legrests on/off with cues.  Kinetron for flexibility and strength, seated in wc at level 60 sec/cm x 25 cycles focusing on quads, and at level 40 cm/sec x 25 cycles x 2 scooted forward in w/c, targeting gluts. W/c propulsion using bil LEs for increased flexibility x 50' with suprevision.  Sit> stand with close supervision, (armrests of w/c turned around to give higher surface to push up on) to RW.  Gait training on level tile with RW x 150', x 50' with CG/min assist.  Bil knees flexed in standing, and trunk rotation absent, but knees do not buckle.  Seated 10 x 1 each R/L long arc quad knee extension, bil toe raises, bil heel raises, reciprocal scooting forward/backward to increase trunk rotation during gait.  Supine propped on wedge due to reflux, R/L knee extension via quad and glut sets, x 5 seconds x 3.  Pt is limited in this position because he is so uncomfortable due to acid reflux/hiccups.  Pt left resting in w/c with bil LEs elevated, and ice packs on bil knees.  Wife present and needs at hand.  PT recommended pt ask to fill 2 more ice pack to place on thighs.     Therapy Documentation Precautions:  Precautions Precautions: Fall, Knee Required Braces or Orthoses: Knee Immobilizer - Right, Knee Immobilizer - Left Knee Immobilizer - Right: Discontinue once straight leg raise with < 10 degree lag Knee Immobilizer - Left: Discontinue once straight leg raise with < 10 degree lag Restrictions Weight Bearing Restrictions: Yes RLE Weight Bearing:  Weight bearing as tolerated LLE Weight Bearing: Weight bearing as tolerated Pain: Pain Assessment Pain Scale: 0-10 Pain Score: 0-No pain Faces Pain Scale: No hurt    Therapy/Group: Individual Therapy  [name] 07/20/2018, 10:36 AM

## 2018-07-21 ENCOUNTER — Inpatient Hospital Stay (HOSPITAL_COMMUNITY): Payer: 59 | Admitting: Occupational Therapy

## 2018-07-21 ENCOUNTER — Inpatient Hospital Stay (HOSPITAL_COMMUNITY): Payer: 59 | Admitting: Physical Therapy

## 2018-07-21 LAB — CBC
HCT: 28.4 % — ABNORMAL LOW (ref 39.0–52.0)
Hemoglobin: 8.9 g/dL — ABNORMAL LOW (ref 13.0–17.0)
MCH: 29.2 pg (ref 26.0–34.0)
MCHC: 31.3 g/dL (ref 30.0–36.0)
MCV: 93.1 fL (ref 80.0–100.0)
PLATELETS: 369 10*3/uL (ref 150–400)
RBC: 3.05 MIL/uL — AB (ref 4.22–5.81)
RDW: 12 % (ref 11.5–15.5)
WBC: 10.3 10*3/uL (ref 4.0–10.5)
nRBC: 0 % (ref 0.0–0.2)

## 2018-07-21 NOTE — Progress Notes (Signed)
PHYSICAL MEDICINE & REHABILITATION PROGRESS NOTE   Subjective/Complaints:  Discussed +stool , started on protonix yesterday with improved hiccups, no recent PUD, due for colonoscopy this yr No abd pain  ROS- neg CP, SOB, N/V/D   Objective:   No results found. No results for input(s): WBC, HGB, HCT, PLT in the last 72 hours. No results for input(s): NA, K, CL, CO2, GLUCOSE, BUN, CREATININE, CALCIUM in the last 72 hours.  Intake/Output Summary (Last 24 hours) at 07/21/2018 0815 Last data filed at 07/21/2018 0239 Gross per 24 hour  Intake 720 ml  Output 2575 ml  Net -1855 ml     Physical Exam: Vital Signs Blood pressure 128/75, pulse 83, temperature 99.1 F (37.3 C), temperature source Oral, resp. rate 18, height 6\' 5"  (1.956 m), weight 131.4 kg, SpO2 99 %.   General: No acute distress Mood and affect are appropriate Heart: Regular rate and rhythm no rubs murmurs or extra sounds Lungs: Clear to auscultation, breathing unlabored, no rales or wheezes Abdomen: Positive bowel sounds, soft nontender to palpation, nondistended Extremities: No clubbing, cyanosis, or edema Skin: No evidence of breakdown, no evidence of rash, knee incision with gauze dressing Neurologic: Cranial nerves II through XII intact, motor strength is 5/5 in bilateral deltoid, bicep, tricep, grip, 4- hip flexor, knee extensors,5 ankle dorsiflexor and plantar flexor Sensory exam normal sensation to light touch and proprioception in bilateral upper and lower extremities  Musculoskeletal: decreased knee flexion and ext , able to do SLR bilaterallu   Assessment/Plan: 1. Functional deficits secondary to Bilateral TKR which require 3+ hours per day of interdisciplinary therapy in a comprehensive inpatient rehab setting.  Physiatrist is providing close team supervision and 24 hour management of active medical problems listed below.  Physiatrist and rehab team continue to assess barriers to  discharge/monitor patient progress toward functional and medical goals  Care Tool:  Bathing    Body parts bathed by patient: Right arm, Left arm, Chest, Abdomen, Front perineal area, Buttocks, Right upper leg, Left upper leg, Face   Body parts bathed by helper: Right lower leg, Left lower leg     Bathing assist Assist Level: Contact Guard/Touching assist     Upper Body Dressing/Undressing Upper body dressing   What is the patient wearing?: Pull over shirt    Upper body assist Assist Level: Set up assist    Lower Body Dressing/Undressing Lower body dressing      What is the patient wearing?: Underwear/pull up, Pants     Lower body assist Assist for lower body dressing: Set up assist     Toileting Toileting Toileting Activity did not occur (Clothing management and hygiene only): Safety/medical concerns(bedpan used)  Toileting assist Assist for toileting: Moderate Assistance - Patient 50 - 74%     Transfers Chair/bed transfer  Transfers assist  Chair/bed transfer activity did not occur: Safety/medical concerns  Chair/bed transfer assist level: Contact Guard/Touching assist     Locomotion Ambulation   Ambulation assist   Ambulation activity did not occur: Safety/medical concerns  Assist level: Contact Guard/Touching assist Assistive device: Walker-rolling Max distance: 150   Walk 10 feet activity   Assist  Walk 10 feet activity did not occur: Safety/medical concerns  Assist level: Contact Guard/Touching assist Assistive device: Walker-rolling   Walk 50 feet activity   Assist Walk 50 feet with 2 turns activity did not occur: Safety/medical concerns  Assist level: Contact Guard/Touching assist Assistive device: Walker-rolling    Walk 150 feet activity   Assist  Walk 150 feet activity did not occur: Safety/medical concerns  Assist level: Contact Guard/Touching assist Assistive device: Walker-rolling    Walk 10 feet on uneven surface   activity   Assist Walk 10 feet on uneven surfaces activity did not occur: Safety/medical concerns   Assist level: Minimal Assistance - Patient > 75% Assistive device: Photographer Will patient use wheelchair at discharge?: Yes Type of Wheelchair: Manual    Wheelchair assist level: Supervision/Verbal cueing Max wheelchair distance: 150    Wheelchair 50 feet with 2 turns activity    Assist        Assist Level: Supervision/Verbal cueing   Wheelchair 150 feet activity     Assist Wheelchair 150 feet activity did not occur: Safety/medical concerns   Assist Level: Supervision/Verbal cueing    Medical Problem List and Plan: 1.  Decreased functional mobility secondary to bilateral TKA 07/12/2018.  Weightbearing as tolerated PT, OT -ELOS 11/13 2.  DVT Prophylaxis/Anticoagulation: Xarelto.  Negative vascular study 3. Pain Management: Oxycodone/Ultram and Robaxin as needed 4. Mood: Provide emotional support 5. Neuropsych: This patient is capable of making decisions on his own behalf. 6. Skin/Wound Care: Routine skin checks, Skin tear Right lateral thigh 7. Fluids/Electrolytes/Nutrition: Routine in and outs with follow-up chemistries Low alb will supplement 8.  Acute blood loss anemia.  Follow-up CBC, Hgb dropped  Again 8.1 stool OB +, recheck CBC today9.  Constipation.  Laxative assistance, GLycolax, sorbitol and dulcolax ordered- has used all of these and has had 3 BMs  9.  Microscopic GI blood loss asymptomatic, multiple laxative with supp may have caused micro tear along with Xarelto, gastritis also a possibility, monitor Hgb if further sig drop may ask GI to eval  LOS: 4 days A FACE TO FACE EVALUATION WAS PERFORMED  Erick Colace 07/21/2018, 8:15 AM

## 2018-07-21 NOTE — Progress Notes (Signed)
Occupational Therapy Session Note  Patient Details  Name: Franklin Lucas MRN: [id number] Date of Birth: 1955/11/26  Today's Date: 07/21/2018 OT Individual Time: 0900-1000 OT Individual Time Calculation (min): 60 min    Short Term Goals: Week 1:  OT Short Term Goal 1 (Week 1): STG=LTG d/t ELOS OT Short Term Goal 2 (Week 1): Pt will perform sit<>stand with minA of one as precursor to functional task completion.  OT Short Term Goal 3 (Week 1): Pt will maintain standing balance during ADL task completion with CGA.   Skilled Therapeutic Interventions/Progress Updates:    Treatment session with focus on dynamic standing balance and activity tolerance during ADL retraining and therapeutic activity. Pt received seated EOB requesting to engage in bathing at shower level.  Pt ambulated to room shower with RW and contact guard.  Pt doffed clothing in standing with use of grab bars for UE support and contact guard when bending to retreive items from floor.  Therapist assisted with washing pt's feet and back, but he completed remainder of bathing with contact guard in standing.  Pt donned underwear and shorts in standing with UE support on RW and min assist for standing balance when in single leg stance to thread pants.  Pt able to reach towards feet to don and fasten shoes.  Pt ambulated 175' to therapy gym with RW progressing from contact guard to close supervision. Engaged in knee flexion/extension with heels slides while seated edge of mat.  Challenged dynamic standing balance without UE support while engaging in ball toss to increase reaching slightly outside BOS to challenge weight shifting and balance reactions with min guard throughout task.  Returned to room via w/c and left upright in w/c with ice applied to knees.  Therapy Documentation Precautions:  Precautions Precautions: Fall, Knee Required Braces or Orthoses: Knee Immobilizer - Right, Knee Immobilizer - Left Knee Immobilizer - Right:  Discontinue once straight leg raise with < 10 degree lag Knee Immobilizer - Left: Discontinue once straight leg raise with < 10 degree lag Restrictions Weight Bearing Restrictions: Yes RLE Weight Bearing: Weight bearing as tolerated LLE Weight Bearing: Weight bearing as tolerated Pain:  Pt with no c/o pain during session.  Reports premedicated.   Therapy/Group: Individual Therapy  Rosalio Loud 07/21/2018, 12:17 PM

## 2018-07-21 NOTE — Progress Notes (Signed)
Physical Therapy Session Note  Patient Details  Name: Franklin Lucas MRN: [id number] Date of Birth: Feb 28, 1956  Today's Date: 07/21/2018 PT Individual Time: 1030-1130 PT Individual Time Calculation (min): 60 min   Short Term Goals: Week 1:  PT Short Term Goal 1 (Week 1): (P) STG = LTG due to short ELOS.  Skilled Therapeutic Interventions/Progress Updates:    Pt received seated in w/c in room, agreeable to PT. Pt reports 5/10 pain in B knees, ice pack to knees at end of therapy session. Sit to stand with CGA to RW throughout therapy session. Ambulation 2 x 150 ft with RW and Supervision. Ascend/descend 8 3" stairs with 2 handrails and min A, step-through gait pattern. Ascend/descend 4 6" stairs with 2 handrails and min A, step-through gait pattern ascending and step-to descending. Sit to/from supine with Supervision. Supine (reclined on wedge due to acid reflux) BLE therex x 10 reps with AAROM for knee flex. Seated BLE therex x 10 reps with AAROM and overpressure for knee flexion. Nustep level 3 x 5 min with B UE/LE for BLE knee ROM to pt tolerance. Pt left seated in w/c in room at end of therapy session with needs in reach, ice pack to B knees.  Therapy Documentation Precautions:  Precautions Precautions: Fall, Knee Required Braces or Orthoses: Knee Immobilizer - Right, Knee Immobilizer - Left Knee Immobilizer - Right: Discontinue once straight leg raise with < 10 degree lag Knee Immobilizer - Left: Discontinue once straight leg raise with < 10 degree lag Restrictions Weight Bearing Restrictions: Yes RLE Weight Bearing: Weight bearing as tolerated LLE Weight Bearing: Weight bearing as tolerated   Therapy/Group: Individual Therapy  Peter Congo, PT, DPT  07/21/2018, 3:51 PM

## 2018-07-21 NOTE — Discharge Instructions (Signed)
Inpatient Rehab Discharge Instructions  My Franklin Lucas Discharge date and time: No discharge date for patient encounter.   Activities/Precautions/ Functional Status: Activity: activity as tolerated Diet: regular diet Wound Care: keep wound clean and dry Functional status:  ___ No restrictions     ___ Walk up steps independently ___ 24/7 supervision/assistance   ___ Walk up steps with assistance ___ Intermittent supervision/assistance  ___ Bathe/dress independently ___ Walk with walker     _x__ Bathe/dress with assistance ___ Walk Independently    ___ Shower independently ___ Walk with assistance    ___ Shower with assistance ___ No alcohol     ___ Return to work/school ________  Special Instructions: No driving    COMMUNITY REFERRALS UPON DISCHARGE:    Outpatient: PT  Agency:[facility] (DR. Lequita Halt OFFICE)   Phone:[phone]   Date of Last Service:07/26/2018  Appointment Date/Time:WILL CALL WIFE TO SET UP APPOINTMENTS  Medical Equipment/Items Ordered:TALL ROLLING WALKER & WIDE 3 IN 1  Agency/Supplier:[facility]   [phone]    My questions have been answered and I understand these instructions. I will adhere to these goals and the provided educational materials after my discharge from the hospital.  Patient/Caregiver Signature _______________________________ Date __________  Clinician Signature _______________________________________ Date __________  Please bring this form and your medication list with you to all your follow-up doctor's appointments.    Information on my medicine - XARELTO (Rivaroxaban)  This medication education was reviewed with me or my healthcare representative as part of my discharge preparation.  The pharmacist that spoke with me during my hospital stay was:  Ulyses Southward, RPH-CPP  Why was Xarelto prescribed for you? Xarelto was prescribed for you to reduce the risk of blood clots forming after orthopedic surgery. The medical term for  these abnormal blood clots is venous thromboembolism (VTE).  What do you need to know about xarelto ? Take your Xarelto ONCE DAILY at the same time every day. You may take it either with or without food.  If you have difficulty swallowing the tablet whole, you may crush it and mix in applesauce just prior to taking your dose.  Take Xarelto exactly as prescribed by your doctor and DO NOT stop taking Xarelto without talking to the doctor who prescribed the medication.  Stopping without other VTE prevention medication to take the place of Xarelto may increase your risk of developing a clot.  After discharge, you should have regular check-up appointments with your healthcare provider that is prescribing your Xarelto.    What do you do if you miss a dose? If you miss a dose, take it as soon as you remember on the same day then continue your regularly scheduled once daily regimen the next day. Do not take two doses of Xarelto on the same day.   Important Safety Information A possible side effect of Xarelto is bleeding. You should call your healthcare provider right away if you experience any of the following: ? Bleeding from an injury or your nose that does not stop. ? Unusual colored urine (red or dark brown) or unusual colored stools (red or black). ? Unusual bruising for unknown reasons. ? A serious fall or if you hit your head (even if there is no bleeding).  Some medicines may interact with Xarelto and might increase your risk of bleeding while on Xarelto. To help avoid this, consult your healthcare provider or pharmacist prior to using any new prescription or non-prescription medications, including herbals, vitamins, non-steroidal anti-inflammatory drugs (NSAIDs) and supplements.  This  website has more information on Xarelto: [email or web address].br.

## 2018-07-21 NOTE — Progress Notes (Signed)
Occupational Therapy Session Note  Patient Details  Name: Taylor Levick MRN: [id number] Date of Birth: 1956/09/07  Today's Date: 07/21/2018 OT Individual Time: 4098-1191 OT Individual Time Calculation (min): 57 min    Short Term Goals: Week 1:  OT Short Term Goal 1 (Week 1): STG=LTG d/t ELOS OT Short Term Goal 2 (Week 1): Pt will perform sit<>stand with minA of one as precursor to functional task completion.  OT Short Term Goal 3 (Week 1): Pt will maintain standing balance during ADL task completion with CGA.   Skilled Therapeutic Interventions/Progress Updates:    Treatment session with focus on functional mobility and bathroom transfers.  Pt received upright in w/c with wife present.  Discussed recommended DME for d/c with recommendation for bariatric BSC and due to pt standing for shower, not recommending seat for shower - both pt and wife in agreement. Pt propelled w/c to ADL apt for BUE strengthening.  Engaged in simulated walk-in shower transfers with stepping over 3" ledge with one UE support to simulate grab bar.  Pt able to complete with min guard alternating leading with Rt and then Lt LE.  Noted pt with increased hip hike and circumduction when stepping with LLE.  Therapist mentioned to pt who then wanted to focus on improved motor movement.  Engaged in blocked practice with alternating stepping and clearing 5" block to address above with noted improvements.  Engaged in sit > stand from therapy mat, progressively lowering height of mat to increase knee flexion and sit > stand from lower surfaces.  Recommend pt have bariatric BSC for UE support and ease of sit > stand with pt and wife in agreement.  Pt completed transfers with RW with close supervision to bariatric [facility], both report pleased with progress.  Therapy Documentation Precautions:  Precautions Precautions: Fall, Knee Required Braces or Orthoses: Knee Immobilizer - Right, Knee Immobilizer - Left Knee Immobilizer - Right:  Discontinue once straight leg raise with < 10 degree lag Knee Immobilizer - Left: Discontinue once straight leg raise with < 10 degree lag Restrictions Weight Bearing Restrictions: Yes RLE Weight Bearing: Weight bearing as tolerated LLE Weight Bearing: Weight bearing as tolerated General:   Vital Signs: Therapy Vitals Temp: 98.9 F (37.2 C) Temp Source: Oral Pulse Rate: 93 Resp: 14 BP: (!) 157/80 Patient Position (if appropriate): Sitting Oxygen Therapy SpO2: 100 % O2 Device: Room Air Pain:   Pt with no c/o pain  Therapy/Group: Individual Therapy  Rosalio Loud 07/21/2018, 4:02 PM

## 2018-07-21 NOTE — Progress Notes (Signed)
Occupational Therapy Session Note  Patient Details  Name: Franklin Lucas MRN: [id number] Date of Birth: 12-13-1955  Today's Date: 07/21/2018 OT Individual Time: 4098-1191 OT Individual Time Calculation (min): 30 min    Short Term Goals: Week 1:  OT Short Term Goal 1 (Week 1): STG=LTG d/t ELOS OT Short Term Goal 2 (Week 1): Pt will perform sit<>stand with minA of one as precursor to functional task completion.  OT Short Term Goal 3 (Week 1): Pt will maintain standing balance during ADL task completion with CGA.   Skilled Therapeutic Interventions/Progress Updates:    1:1. Pt completes ambulation with RW to/from dayroom with supervision. Pt completes 2x15 each exercise LAQ (feet dangling on elevated mat and VC for dorsiflexion at ankle to cue pt for full extension at knee- pt verbalize this helps), alternating toe taps on large cone standing with RW with VC for 90* at hips and knees, and towel slides on floor for knee flex/ext. Exited session with pt seated in w/c, call light in reach and wife in room  Therapy Documentation Precautions:  Precautions Precautions: Fall, Knee Required Braces or Orthoses: Knee Immobilizer - Right, Knee Immobilizer - Left Knee Immobilizer - Right: Discontinue once straight leg raise with < 10 degree lag Knee Immobilizer - Left: Discontinue once straight leg raise with < 10 degree lag Restrictions Weight Bearing Restrictions: Yes RLE Weight Bearing: Weight bearing as tolerated LLE Weight Bearing: Weight bearing as tolerated  Therapy/Group: Individual Therapy  Shon Hale 07/21/2018, 1:38 PM

## 2018-07-22 NOTE — Progress Notes (Signed)
[place] PHYSICAL MEDICINE & REHABILITATION PROGRESS NOTE   Subjective/Complaints:  Discussed CBC result, no therapy today patient is looking for additional exercises.  We also discussed possibility of therapeutic grounds pass ROS- neg CP, SOB, N/V/D   Objective:   No results found. Recent Labs    07/21/18 0835  WBC 10.3  HGB 8.9*  HCT 28.4*  PLT 369   No results for input(s): NA, K, CL, CO2, GLUCOSE, BUN, CREATININE, CALCIUM in the last 72 hours.  Intake/Output Summary (Last 24 hours) at 07/22/2018 1138 Last data filed at 07/22/2018 0839 Gross per 24 hour  Intake 640 ml  Output 1200 ml  Net -560 ml     Physical Exam: Vital Signs Blood pressure 137/74, pulse 89, temperature 98.3 F (36.8 C), resp. rate 18, height 6\' 5"  (1.956 m), weight 131.4 kg, SpO2 100 %.   General: No acute distress Mood and affect are appropriate Heart: Regular rate and rhythm no rubs murmurs or extra sounds Lungs: Clear to auscultation, breathing unlabored, no rales or wheezes Abdomen: Positive bowel sounds, soft nontender to palpation, nondistended Extremities: No clubbing, cyanosis, or edema Skin: No evidence of breakdown, no evidence of rash, knee incision with gauze dressing Neurologic: Cranial nerves II through XII intact, motor strength is 5/5 in bilateral deltoid, bicep, tricep, grip, 4- hip flexor, knee extensors,5 ankle dorsiflexor and plantar flexor Sensory exam normal sensation to light touch and proprioception in bilateral upper and lower extremities  Musculoskeletal: decreased knee flexion and ext , able to do SLR bilaterallu   Assessment/Plan: 1. Functional deficits secondary to Bilateral TKR which require 3+ hours per day of interdisciplinary therapy in a comprehensive inpatient rehab setting.  Physiatrist is providing close team supervision and 24 hour management of active medical problems listed below.  Physiatrist and rehab team continue to assess barriers to  discharge/monitor patient progress toward functional and medical goals  Care Tool:  Bathing    Body parts bathed by patient: Right arm, Left arm, Chest, Abdomen, Front perineal area, Buttocks, Right upper leg, Left upper leg, Face   Body parts bathed by helper: Right lower leg, Left lower leg     Bathing assist Assist Level: Contact Guard/Touching assist     Upper Body Dressing/Undressing Upper body dressing   What is the patient wearing?: Pull over shirt    Upper body assist Assist Level: Set up assist    Lower Body Dressing/Undressing Lower body dressing      What is the patient wearing?: Underwear/pull up, Pants     Lower body assist Assist for lower body dressing: Contact Guard/Touching assist(in standing)     Toileting Toileting Toileting Activity did not occur (Clothing management and hygiene only): (bedpan used)  Toileting assist Assist for toileting: Moderate Assistance - Patient 50 - 74%     Transfers Chair/bed transfer  Transfers assist  Chair/bed transfer activity did not occur: Safety/medical concerns  Chair/bed transfer assist level: Contact Guard/Touching assist     Locomotion Ambulation   Ambulation assist   Ambulation activity did not occur: Safety/medical concerns  Assist level: Contact Guard/Touching assist Assistive device: Walker-rolling Max distance: 150   Walk 10 feet activity   Assist  Walk 10 feet activity did not occur: Safety/medical concerns  Assist level: Contact Guard/Touching assist Assistive device: Walker-rolling   Walk 50 feet activity   Assist Walk 50 feet with 2 turns activity did not occur: Safety/medical concerns  Assist level: Contact Guard/Touching assist Assistive device: Walker-rolling    Walk 150 feet  activity   Assist Walk 150 feet activity did not occur: Safety/medical concerns  Assist level: Contact Guard/Touching assist Assistive device: Walker-rolling    Walk 10 feet on uneven surface   activity   Assist Walk 10 feet on uneven surfaces activity did not occur: Safety/medical concerns   Assist level: Minimal Assistance - Patient > 75% Assistive device: Photographer Will patient use wheelchair at discharge?: Yes Type of Wheelchair: Manual    Wheelchair assist level: Supervision/Verbal cueing Max wheelchair distance: 150    Wheelchair 50 feet with 2 turns activity    Assist        Assist Level: Supervision/Verbal cueing   Wheelchair 150 feet activity     Assist Wheelchair 150 feet activity did not occur: Safety/medical concerns   Assist Level: Supervision/Verbal cueing    Medical Problem List and Plan: 1.  Decreased functional mobility secondary to bilateral TKA 07/12/2018.  Weightbearing as tolerated PT, OT -ELOS 11/13 directed additional hip flexor exercises hip abductor and hip adductor exercises 2.  DVT Prophylaxis/Anticoagulation: Xarelto.  Negative vascular study 3. Pain Management: Oxycodone/Ultram and Robaxin as needed 4. Mood: Provide emotional support 5. Neuropsych: This patient is capable of making decisions on his own behalf. 6. Skin/Wound Care: Routine skin checks, Skin tear Right lateral thigh 7. Fluids/Electrolytes/Nutrition: Routine in and outs with follow-up chemistries Low alb will supplement 8.  Acute blood loss anemia.  Follow-up CBC, Hgb dropped  Again 8.1 stool OB +, recheck CBC today9.  Constipation.  Laxative assistance, GLycolax, sorbitol and dulcolax ordered- has used all of these and has had 3 BMs  9.  Microscopic GI blood loss asymptomatic, multiple laxative with supp may have caused micro tear along with Xarelto, gastritis also a possibility, no further drop in hemoglobin, will recheck next week LOS: 5 days A FACE TO FACE EVALUATION WAS PERFORMED  Erick Colace 07/22/2018, 11:38 AM

## 2018-07-23 ENCOUNTER — Inpatient Hospital Stay (HOSPITAL_COMMUNITY): Payer: 59

## 2018-07-23 MED ORDER — TRAZODONE HCL 50 MG PO TABS
50.0000 mg | ORAL_TABLET | Freq: Every evening | ORAL | Status: DC | PRN
  Administered 2018-07-23 – 2018-07-24 (×2): 50 mg via ORAL
  Filled 2018-07-23 (×2): qty 1

## 2018-07-23 NOTE — Progress Notes (Addendum)
[place] PHYSICAL MEDICINE & REHABILITATION PROGRESS NOTE   Subjective/Complaints:  Seen in gym no new issues ROS- neg CP, SOB, N/V/D   Objective:   No results found. Recent Labs    07/21/18 0835  WBC 10.3  HGB 8.9*  HCT 28.4*  PLT 369   No results for input(s): NA, K, CL, CO2, GLUCOSE, BUN, CREATININE, CALCIUM in the last 72 hours.  Intake/Output Summary (Last 24 hours) at 07/23/2018 1033 Last data filed at 07/23/2018 0819 Gross per 24 hour  Intake 600 ml  Output 2050 ml  Net -1450 ml     Physical Exam: Vital Signs Blood pressure 136/85, pulse 91, temperature 98.9 F (37.2 C), temperature source Oral, resp. rate 18, height 6\' 5"  (1.956 m), weight 131.4 kg, SpO2 100 %.   General: No acute distress Mood and affect are appropriate Heart: Regular rate and rhythm no rubs murmurs or extra sounds Lungs: Clear to auscultation, breathing unlabored, no rales or wheezes Abdomen: Positive bowel sounds, soft nontender to palpation, nondistended Extremities: No clubbing, cyanosis, or edema Skin: No evidence of breakdown, no evidence of rash, knee incision with gauze dressing Neurologic: Cranial nerves II through XII intact, motor strength is 5/5 in bilateral deltoid, bicep, tricep, grip, 4- hip flexor, knee extensors,5 ankle dorsiflexor and plantar flexor Sensory exam normal sensation to light touch and proprioception in bilateral upper and lower extremities  Musculoskeletal: decreased knee flexion and ext , able to do SLR bilaterallu   Assessment/Plan: 1. Functional deficits secondary to Bilateral TKR which require 3+ hours per day of interdisciplinary therapy in a comprehensive inpatient rehab setting.  Physiatrist is providing close team supervision and 24 hour management of active medical problems listed below.  Physiatrist and rehab team continue to assess barriers to discharge/monitor patient progress toward functional and medical goals  Care Tool:  Bathing     Body parts bathed by patient: Right arm, Left arm, Chest, Abdomen, Front perineal area, Buttocks, Right upper leg, Left upper leg, Face   Body parts bathed by helper: Right lower leg, Left lower leg     Bathing assist Assist Level: Contact Guard/Touching assist     Upper Body Dressing/Undressing Upper body dressing   What is the patient wearing?: Pull over shirt    Upper body assist Assist Level: Set up assist    Lower Body Dressing/Undressing Lower body dressing      What is the patient wearing?: Underwear/pull up, Pants     Lower body assist Assist for lower body dressing: Contact Guard/Touching assist(in standing)     Toileting Toileting Toileting Activity did not occur (Clothing management and hygiene only): (bedpan used)  Toileting assist Assist for toileting: Moderate Assistance - Patient 50 - 74%     Transfers Chair/bed transfer  Transfers assist  Chair/bed transfer activity did not occur: Safety/medical concerns  Chair/bed transfer assist level: Contact Guard/Touching assist     Locomotion Ambulation   Ambulation assist   Ambulation activity did not occur: Safety/medical concerns  Assist level: Contact Guard/Touching assist Assistive device: Walker-rolling Max distance: 150   Walk 10 feet activity   Assist  Walk 10 feet activity did not occur: Safety/medical concerns  Assist level: Contact Guard/Touching assist Assistive device: Walker-rolling   Walk 50 feet activity   Assist Walk 50 feet with 2 turns activity did not occur: Safety/medical concerns  Assist level: Contact Guard/Touching assist Assistive device: Walker-rolling    Walk 150 feet activity   Assist Walk 150 feet activity did not occur: Safety/medical  concerns  Assist level: Contact Guard/Touching assist Assistive device: Walker-rolling    Walk 10 feet on uneven surface  activity   Assist Walk 10 feet on uneven surfaces activity did not occur: Safety/medical  concerns   Assist level: Minimal Assistance - Patient > 75% Assistive device: Photographer Will patient use wheelchair at discharge?: Yes Type of Wheelchair: Manual    Wheelchair assist level: Supervision/Verbal cueing Max wheelchair distance: 150    Wheelchair 50 feet with 2 turns activity    Assist        Assist Level: Supervision/Verbal cueing   Wheelchair 150 feet activity     Assist Wheelchair 150 feet activity did not occur: Safety/medical concerns   Assist Level: Supervision/Verbal cueing    Medical Problem List and Plan: 1.  Decreased functional mobility secondary to bilateral TKA 07/12/2018.  Weightbearing as tolerated PT, OT -ELOS 11/13  2.  DVT Prophylaxis/Anticoagulation: Xarelto.  Negative vascular study 3. Pain Management: Oxycodone/Ultram and Robaxin as needed 4. Mood: Provide emotional support 5. Neuropsych: This patient is capable of making decisions on his own behalf. 6. Skin/Wound Care: Routine skin checks, Skin tear Right lateral thigh 7. Fluids/Electrolytes/Nutrition: Routine in and outs with follow-up chemistries Low alb will supplement 8.  Acute blood loss anemia.  Follow-up CBC, Hgb stable stool OB + may need repeat Hemoccult once off Xarelto, he did receive Dulcolax suppository was quite constipated at the time he had his stool checked can follow-up with PCP 9.  Constipation.  Laxative assistance, GLycolax, sorbitol and dulcolax ordered- has used all of these and has had 3 BMs  9.  Microscopic GI blood loss asymptomatic, multiple laxative with supp may have caused micro tear along with Xarelto, gastritis also a possibility, no further drop in hemoglobin, will recheck tomorrow LOS: 6 days A FACE TO FACE EVALUATION WAS PERFORMED  Erick Colace 07/23/2018, 10:33 AM

## 2018-07-23 NOTE — Progress Notes (Signed)
Physical Therapy Session Note  Patient Details  Name: Franklin Lucas MRN: [id number] Date of Birth: 1956/06/29  Today's Date: 07/23/2018 PT Individual Time: 1300-1357 and 4098-1191 PT Individual Time Calculation (min): 57 min and 55 min   Short Term Goals: Week 1:  PT Short Term Goal 1 (Week 1): (P) STG = LTG due to short ELOS.  Skilled Therapeutic Interventions/Progress Updates:    Session 1: Pt seated EOB upon PT arrival, agreeable to therapy tx and denies pain. Pt ambulated from [employer] with supervision and RW. Pt performed real car transfer with their SUV this session, supervision for car transfer with RW and pt able to lift LEs into car without assist. Pt ambulated back to unit with RW and supervision. Session focused on knee extension ROM, pt maintains knees flexed to 25 degrees during gait. Pt seated edge of mat educated on knee extension stretch with gentle over pressure for increased ROM, 5 x 30 sec holds bilaterally. Pt continues to lack significant amount of knee extension. Pt transferred to long sitting on mat while therapist performed knee extension mobilizations 2 x 30 sec bilaterally. Therapist educated pt on keeping knee extended throughout the day and working on knee extension stretches 3x per day at home. Therapist also educated pt and demonstrated patellar mobilizations and scar tissue massage. Pt ambulated back to room and left seated EOB in care of wife.   Session 2: Pt seated EOB upon PT arrival, agreeable to therapy tx and denies pain at rest, reports up to 6/10 pain with stretches. Pt ambulated from room>gym with RW and supervision x 200 ft. Pt transferred to mat with supervision. Therapist provided pt with handout this session for HEP, pt performed all exercises including: seated knee extension stretch, LAQ, patellar mobilizations, SLR, heel slides, SAQ and quad sets. Therapist providing instructions on proper techniques. Therapist performed 2 x 30 sec knee  extension manual mobilizations for ROM. Pt ambulated back to room and left seated in w/c with wife present, needs in reach .   Therapy Documentation Precautions:  Precautions Precautions: Fall, Knee Required Braces or Orthoses: Knee Immobilizer - Right, Knee Immobilizer - Left Knee Immobilizer - Right: Discontinue once straight leg raise with < 10 degree lag Knee Immobilizer - Left: Discontinue once straight leg raise with < 10 degree lag Restrictions Weight Bearing Restrictions: Yes RLE Weight Bearing: Weight bearing as tolerated LLE Weight Bearing: Weight bearing as tolerated   Therapy/Group: Individual Therapy  [name] 07/23/2018, 7:59 AM

## 2018-07-23 NOTE — Progress Notes (Signed)
Occupational Therapy Session Note  Patient Details  Name: Franklin Lucas MRN: [id number] Date of Birth: 09/26/1955  Today's Date: 07/23/2018 OT Individual Time: 9249-3241 OT Individual Time Calculation (min): 70 min    Short Term Goals: Week 1:  OT Short Term Goal 1 (Week 1): STG=LTG d/t ELOS OT Short Term Goal 2 (Week 1): Pt will perform sit<>stand with minA of one as precursor to functional task completion.  OT Short Term Goal 3 (Week 1): Pt will maintain standing balance during ADL task completion with CGA.   Skilled Therapeutic Interventions/Progress Updates:    1:1. Pt and wife present with no c/p pain. Pt completes ambulation throughout session at community distances with [facility] for RW management in close quarters and side stepping when obstacles are too narrow with supervision. Pt completes towel slides on floor and LAQ reciprocally with feet dangling at EOM. Pt completes standing balance activites reaching to floor to increasingly smaller objects to challenge balance. Pt unloads diswasher with no UE support and VC for RW placement in kitchen. Pt issued RW bag for energy conservation while using RW at home. Pt completes sit to stand from lowest mat setting with BUE use for power up with supervision. Exited sesison with pt seated in w/c, call light in reach and all needs met.  Therapy Documentation Precautions:  Precautions Precautions: Fall, Knee Required Braces or Orthoses: Knee Immobilizer - Right, Knee Immobilizer - Left Knee Immobilizer - Right: Discontinue once straight leg raise with < 10 degree lag Knee Immobilizer - Left: Discontinue once straight leg raise with < 10 degree lag Restrictions Weight Bearing Restrictions: Yes RLE Weight Bearing: Weight bearing as tolerated LLE Weight Bearing: Weight bearing as tolerated   Therapy/Group: Individual Therapy  Tonny Branch 07/23/2018, 9:29 AM

## 2018-07-24 ENCOUNTER — Inpatient Hospital Stay (HOSPITAL_COMMUNITY): Payer: 59

## 2018-07-24 ENCOUNTER — Inpatient Hospital Stay (HOSPITAL_COMMUNITY): Payer: 59 | Admitting: Physical Therapy

## 2018-07-24 LAB — CBC
HCT: 29.8 % — ABNORMAL LOW (ref 39.0–52.0)
Hemoglobin: 9.3 g/dL — ABNORMAL LOW (ref 13.0–17.0)
MCH: 29.4 pg (ref 26.0–34.0)
MCHC: 31.2 g/dL (ref 30.0–36.0)
MCV: 94.3 fL (ref 80.0–100.0)
PLATELETS: 464 10*3/uL — AB (ref 150–400)
RBC: 3.16 MIL/uL — ABNORMAL LOW (ref 4.22–5.81)
RDW: 12.7 % (ref 11.5–15.5)
WBC: 9.8 10*3/uL (ref 4.0–10.5)
nRBC: 0 % (ref 0.0–0.2)

## 2018-07-24 NOTE — Progress Notes (Signed)
Social Work Patient ID: Franklin Lucas, male   DOB: 1956/04/06, 62 y.o.   MRN: [id number] Pt would like to go home tomorrow instead of Wed. Team and MD agreeable will work toward discharge tomorrow.

## 2018-07-24 NOTE — Progress Notes (Signed)
Occupational Therapy Discharge Summary  Patient Details  Name: Franklin Lucas MRN: [id number] Date of Birth: April 25, 1956  Today's Date: 07/24/2018 OT Individual Time: 7062-3762 OT Individual Time Calculation (min): 57 min   1;1. Pt received seated in w/c with wife present. tp completes ambualtion in room to gather all needed ADL items and completes bathing at shower level, toileting, dressing and grooming at MOD I standing level. Only exception to thread BLE into pants pt sits on elevated toilet seat. Pt able to apply teds/shoes. Pt completes ambualtion with RW to/from all tx spaces with increased time and RW. Pt sits EOM for reciprocal LE swings as well as towel slides 2x1 min each LE. Pt completes 3x15 isometric hold knee extension pressing into pillow in supported long sitting with red foam wedge. Exited session with pt seated in w/c, call light in reach and all needs met  Session 2: 1300-1400 60 MIN  1:1. Pt just received pain medication 30 min prior to session reportin 4/10pain. OT makes pt MOD I with RW in room and explains to pt/wife. Pt completes 10 min on Nustep workload 4 (5 min no use of UE) and work load 5 (UE use allowed) to improve flexion/extension in B knees as well as functional endurance. Pt completes BLE therex HEP with supervision and min VC for technique for patellar mobilizations as well as recommendations to use towel tolls/pillows for support PRN. Exite dsession with pt seated in w/c, call light in reach and all needs met    Patient has met 10 of 10 long term goals due to improved activity tolerance, improved balance, postural control, ability to compensate for deficits and functional use of  RIGHT lower and LEFT lower extremity.  Patient to discharge at overall Modified Independent level.  Patient's care partner is independent to provide the necessary physical assistance at discharge.    Reasons goals not met: n/a  Recommendation:  No follow up OT   Equipment: wide  BSC  Reasons for discharge: treatment goals met  Patient/family agrees with progress made and goals achieved: Yes  OT Discharge Precautions/Restrictions  Precautions Precautions: Fall;Knee Restrictions Weight Bearing Restrictions: Yes RLE Weight Bearing: Weight bearing as tolerated LLE Weight Bearing: Weight bearing as tolerated General   Vital Signs  Pain Pain Assessment Pain Score: 4  PAINAD (Pain Assessment in Advanced Dementia) Breathing: normal Negative Vocalization: none Facial Expression: smiling or inexpressive Body Language: relaxed Consolability: no need to console PAINAD Score: 0 Vision Patient Visual Report: No change from baseline Vision Assessment?: No apparent visual deficits Perception  Perception: Within Functional Limits Praxis Praxis: Intact Cognition Overall Cognitive Status: Within Functional Limits for tasks assessed Arousal/Alertness: Awake/alert Orientation Level: Oriented X4 Memory: Appears intact Awareness: Appears intact Problem Solving: Appears intact Safety/Judgment: Appears intact Sensation Sensation Light Touch: Impaired by gross assessment Hot/Cold: Appears Intact Proprioception: Not tested Stereognosis: Appears Intact Coordination Gross Motor Movements are Fluid and Coordinated: Yes(UB) Fine Motor Movements are Fluid and Coordinated: Yes Motor  Motor Motor - Skilled Clinical Observations: generalized weakness improved since EVAL Mobility  Bed Mobility Rolling Right: Independent Supine to Sit: Independent Sit to Supine: Independent Transfers Sit to Stand: Independent Stand to Sit: Independent  Trunk/Postural Assessment  Cervical Assessment Cervical Assessment: Within Functional Limits Thoracic Assessment Thoracic Assessment: Within Functional Limits Lumbar Assessment Lumbar Assessment: Within Functional Limits Postural Control Postural Control: Within Functional Limits  Balance Balance Balance Assessed:  Yes Static Sitting Balance Static Sitting - Balance Support: Feet supported;No upper extremity supported Static Sitting - Level  of Assistance: 7: Independent Dynamic Sitting Balance Dynamic Sitting - Balance Support: No upper extremity supported Dynamic Sitting - Level of Assistance: 7: Independent Static Standing Balance Static Standing - Level of Assistance: 7: Independent Dynamic Standing Balance Dynamic Standing - Balance Support: Left upper extremity supported Dynamic Standing - Level of Assistance: 6: Modified independent (Device/Increase time) Extremity/Trunk Assessment RUE Assessment RUE Assessment: Within Functional Limits LUE Assessment LUE Assessment: Within Functional Limits   Tonny Branch 07/24/2018, 11:07 AM

## 2018-07-24 NOTE — Progress Notes (Signed)
Physical Therapy Session Note  Patient Details  Name: Franklin Lucas MRN: [id number] Date of Birth: 1956/06/03  Today's Date: 07/24/2018 PT Individual Time: 0805-0917 PT Individual Time Calculation (min): 72 min   Short Term Goals: Week 1:  PT Short Term Goal 1 (Week 1): (P) STG = LTG due to short ELOS.  Skilled Therapeutic Interventions/Progress Updates:  Pt received sitting on EOB with wife present for session. Pt without c/o pain at rest but with behaviors demonstrating discomfort with movement & exercises and rest breaks taken PRN. Discussed d/c plans, f/u recommendations of OPPT & use of RW with pt & wife. Pt completes transfers & bed mobility with mod I, ambulates around unit in controlled environment over even surface with mod I & RW, negotiates ramp, uneven surface & curb with supervision & RW with decreased knee flexion requiring cuing for safety to prevent catching toes on step. Pt negotiates 12 steps with B rails and supervision. Pt utilized nu-step on level 3 x 12 minutes, adjusting seat distance to promote increasing B knee flexion. Provided pt with B TKA HEP and pt performed the following exercises on the mat table: ankle pumps, quad sets, hip adduction pillow squeezes, heel slides, short arc quads, straight leg raises, hip abduction slides, seated heel slides with instructional cuing for technique. Discussed active assistive heel slides and long arc quads with pt as he was unable to attempt those due to time constraints. At end of session pt left sitting on EOB with wife present to supervise.   Pt reports no concerns regarding d/c home & all questions answered to pt satisfaction.   Pt demonstrates ~90 degrees R knee flexion (AAROM) and ~100 degrees L knee flexion (AAROM) when performing seated heel slides.   Therapy Documentation Precautions:  Precautions Precautions: Fall, Knee Required Braces or Orthoses: Knee Immobilizer - Right, Knee Immobilizer - Left Knee Immobilizer -  Right: Discontinue once straight leg raise with < 10 degree lag Knee Immobilizer - Left: Discontinue once straight leg raise with < 10 degree lag Restrictions Weight Bearing Restrictions: Yes RLE Weight Bearing: Weight bearing as tolerated LLE Weight Bearing: Weight bearing as tolerated    Therapy/Group: Individual Therapy  Sandi Mariscal 07/24/2018, 12:23 PM

## 2018-07-24 NOTE — Progress Notes (Signed)
Social Work  Discharge Note  The overall goal for the admission was met for:   Discharge location: Yes-HOME WITH WIFE WHO CAN BE THERE AND PROVIDE SUPERVISION  Length of Stay: Yes-8 DAYS  Discharge activity level: Yes-SUPERVISION LEVEL  Home/community participation: Yes  Services provided included: MD, RD, PT, OT, RN, CM, Pharmacy and [place]: Private Insurance: [facility]  Follow-up services arranged: Outpatient: [facility] TO CALL WIFE TO SET UP APPOINTMENTS, DME: [facility] WIDE 3 IN 1 and Patient/Family has no preference for HH/DME agencies  Comments (or additional information):PT DID WELL AND REACHED MOD/I LEVEL QUICKLY. GOING TO OP AT SURGEON'S OFFICE  Patient/Family verbalized understanding of follow-up arrangements: Yes  Individual responsible for coordination of the follow-up plan: SELF & [name]-WIFE  Confirmed correct DME delivered: Elease Hashimoto 07/24/2018    Elease Hashimoto

## 2018-07-24 NOTE — Progress Notes (Addendum)
Physical Therapy Discharge Summary  Patient Details  Name: Franklin Lucas MRN: [id number] Date of Birth: April 01, 1956  Today's Date: 07/24/2018    Patient has met 9 of 9 long term goals due to improved activity tolerance, improved balance, increased strength, increased range of motion and decreased pain.  Patient to discharge at an ambulatory level supervision<>mod I with RW.   Patient's care partner is independent to provide the necessary physical assistance at discharge.  Reasons goals not met: n/a  Recommendation:  Patient will benefit from ongoing skilled PT services in outpatient setting to continue to advance safe functional mobility, address ongoing impairments in B knee ROM, BLE strength, endurance, progress gait with LRAD as appropriate, and minimize fall risk.  Equipment: RW  Reasons for discharge: treatment goals met  Patient/family agrees with progress made and goals achieved: Yes  PT Discharge Precautions/Restrictions Precautions Precautions: Fall;Knee Restrictions Weight Bearing Restrictions: Yes RLE Weight Bearing: Weight bearing as tolerated LLE Weight Bearing: Weight bearing as tolerated  Vision/Perception  Pt denies changes in baseline vision (pt wears glasses for reading at baseline).   Cognition Overall Cognitive Status: Within Functional Limits for tasks assessed Arousal/Alertness: Awake/alert Orientation Level: Oriented X4 Memory: Appears intact Awareness: Appears intact Problem Solving: Appears intact Safety/Judgment: Appears intact  Sensation Pt reports slight numbness in lateral aspect of L knee. Pt also reports numbness in first 3 digits on R hand but reports prior existing need for carpal tunnel surgery.  B feet sensation & proprioception equal and intact.   Motor  Motor Motor - Discharge Observations: generalized weakness 2/2 B TKA   Mobility Bed Mobility Bed Mobility: Rolling Right;Rolling Left;Supine to Sit;Sit to Supine Rolling Right:  Independent Rolling Left: Independent Supine to Sit: Independent Sit to Supine: Independent Transfers Transfers: Sit to Stand;Stand to Sit Sit to Stand: Independent with assistive device(RW) Stand to Sit: Independent with assistive device(RW) Transfer (Assistive device): Rolling walker  Locomotion  Gait Ambulation: Yes Gait Assistance: Independent with assistive device Gait Distance (Feet): 150 Feet Assistive device: Rolling walker Gait Assistance Details: Verbal cues for technique Gait Gait: Yes Gait Pattern: Impaired Gait Pattern: (decreased B knee extension during stance phace, decreased B knee flexion during swing phase, decreased heel strike BLE) Gait velocity: decreased Stairs / Additional Locomotion Stairs: Yes Stairs Assistance: Supervision/Verbal cueing Stair Management Technique: Two rails Number of Stairs: 12 Height of Stairs: (6" + 3") Ramp: Supervision/Verbal cueing(ambulatory with RW) Curb: Supervision/Verbal cueing(with RW) Wheelchair Mobility Wheelchair Mobility: No   Trunk/Postural Assessment  Cervical Assessment Cervical Assessment: Within Functional Limits Thoracic Assessment Thoracic Assessment: Within Functional Limits Lumbar Assessment Lumbar Assessment: Within Functional Limits Postural Control Postural Control: Within Functional Limits   Balance Balance Balance Assessed: Yes Dynamic Standing Balance Dynamic Standing - Balance Support: Bilateral upper extremity supported;During functional activity Dynamic Standing - Level of Assistance: 6: Modified independent (Device/Increase time) Dynamic Standing - Balance Activities: (during gait)  Extremity Assessment  RUE Assessment RUE Assessment: Within Functional Limits LUE Assessment LUE Assessment: Within Functional Limits RLE Assessment Passive Range of Motion (PROM) Comments: lacking 16 deg of knee extension, knee flexion to 75 deg on 07/23/18 (able to achieve ~90 degrees when performing  heel slides sitting EOM 07/24/18) LLE Assessment Passive Range of Motion (PROM) Comments: lacking 20 deg of knee extension, knee flexion to 75 deg on 07/23/18 (able to achieve ~100 degrees when performing heel slides sitting EOM 07/24/18)    Waunita Schooner 07/24/2018, 12:33 PM

## 2018-07-24 NOTE — Discharge Summary (Signed)
Discharge summary job [id number]

## 2018-07-24 NOTE — Progress Notes (Signed)
Franklin Lucas PHYSICAL MEDICINE & REHABILITATION PROGRESS NOTE   Subjective/Complaints:  No issues overnite  ROS- neg CP, SOB, N/V/D   Objective:   No results found. Recent Labs    07/21/18 0835 07/24/18 0701  WBC 10.3 9.8  HGB 8.9* 9.3*  HCT 28.4* 29.8*  PLT 369 464*   No results for input(s): NA, K, CL, CO2, GLUCOSE, BUN, CREATININE, CALCIUM in the last 72 hours.  Intake/Output Summary (Last 24 hours) at 07/24/2018 0739 Last data filed at 07/24/2018 0449 Gross per 24 hour  Intake 840 ml  Output 1750 ml  Net -910 ml     Physical Exam: Vital Signs Blood pressure (!) 150/78, pulse 89, temperature 98 F (36.7 C), temperature source Oral, resp. rate 18, height 6\' 5"  (1.956 m), weight 131.4 kg, SpO2 95 %.   General: No acute distress Mood and affect are appropriate Heart: Regular rate and rhythm no rubs murmurs or extra sounds Lungs: Clear to auscultation, breathing unlabored, no rales or wheezes Abdomen: Positive bowel sounds, soft nontender to palpation, nondistended Extremities: No clubbing, cyanosis, or edema Skin: No evidence of breakdown, no evidence of rash, knee incision with gauze dressing Neurologic: Cranial nerves II through XII intact, motor strength is 5/5 in bilateral deltoid, bicep, tricep, grip, 4- hip flexor, knee extensors,5 ankle dorsiflexor and plantar flexor Sensory exam normal sensation to light touch and proprioception in bilateral upper and lower extremities  Musculoskeletal: decreased knee flexion and ext , able to do SLR bilaterallu   Assessment/Plan: 1. Functional deficits secondary to Bilateral TKR which require 3+ hours per day of interdisciplinary therapy in a comprehensive inpatient rehab setting.  Physiatrist is providing close team supervision and 24 hour management of active medical problems listed below.  Physiatrist and rehab team continue to assess barriers to discharge/monitor patient progress toward functional and medical  goals  Care Tool:  Bathing    Body parts bathed by patient: Right arm, Left arm, Chest, Abdomen, Front perineal area, Buttocks, Right upper leg, Left upper leg, Face   Body parts bathed by helper: Right lower leg, Left lower leg     Bathing assist Assist Level: Contact Guard/Touching assist     Upper Body Dressing/Undressing Upper body dressing   What is the patient wearing?: Pull over shirt    Upper body assist Assist Level: Set up assist    Lower Body Dressing/Undressing Lower body dressing      What is the patient wearing?: Underwear/pull up, Pants     Lower body assist Assist for lower body dressing: Contact Guard/Touching assist(in standing)     Toileting Toileting Toileting Activity did not occur (Clothing management and hygiene only): (bedpan used)  Toileting assist Assist for toileting: Moderate Assistance - Patient 50 - 74%     Transfers Chair/bed transfer  Transfers assist  Chair/bed transfer activity did not occur: Safety/medical concerns  Chair/bed transfer assist level: Supervision/Verbal cueing     Locomotion Ambulation   Ambulation assist   Ambulation activity did not occur: Safety/medical concerns  Assist level: Supervision/Verbal cueing Assistive device: Walker-rolling Max distance: 150   Walk 10 feet activity   Assist  Walk 10 feet activity did not occur: Safety/medical concerns  Assist level: Supervision/Verbal cueing Assistive device: Walker-rolling   Walk 50 feet activity   Assist Walk 50 feet with 2 turns activity did not occur: Safety/medical concerns  Assist level: Supervision/Verbal cueing Assistive device: Walker-rolling    Walk 150 feet activity   Assist Walk 150 feet activity did not occur:  Safety/medical concerns  Assist level: Supervision/Verbal cueing Assistive device: Walker-rolling    Walk 10 feet on uneven surface  activity   Assist Walk 10 feet on uneven surfaces activity did not occur:  Safety/medical concerns   Assist level: Minimal Assistance - Patient > 75% Assistive device: Photographer Will patient use wheelchair at discharge?: Yes Type of Wheelchair: Manual    Wheelchair assist level: Supervision/Verbal cueing Max wheelchair distance: 150    Wheelchair 50 feet with 2 turns activity    Assist        Assist Level: Supervision/Verbal cueing   Wheelchair 150 feet activity     Assist Wheelchair 150 feet activity did not occur: Safety/medical concerns   Assist Level: Supervision/Verbal cueing    Medical Problem List and Plan: 1.  Decreased functional mobility secondary to bilateral TKA 07/12/2018.  Weightbearing as tolerated PT, OT -ELOS 11/13  2.  DVT Prophylaxis/Anticoagulation: Xarelto.  Negative vascular study 3. Pain Management: Oxycodone/Ultram and Robaxin as needed 4. Mood: Provide emotional support 5. Neuropsych: This patient is capable of making decisions on his own behalf. 6. Skin/Wound Care: Routine skin checks, Skin tear Right lateral thigh 7. Fluids/Electrolytes/Nutrition: Routine in and outs with follow-up chemistries Low alb will supplement 8.  Acute blood loss anemia. Slowly improving  9.  Constipation.  Laxative assistance, GLycolax, sorbitol and dulcolax ordered- has used all of these and has had 3 BMs  9.  Microscopic GI blood loss asymptomatic, multiple laxative with supp may have caused micro tear along with Xarelto, gastritis also a possibility, no further drop in hemoglobin, 10.  Constipation resolving cont miralax daily LOS: 7 days A FACE TO FACE EVALUATION WAS PERFORMED  Franklin Lucas 07/24/2018, 7:39 AM

## 2018-07-24 NOTE — Discharge Summary (Signed)
Franklin Lucas MEDICAL RECORD [id number] ACCOUNT [id number] DATE OF BIRTH:April 18, 1956 FACILITY: [facility] LOCATION: [facility] PHYSICIAN:ANDREW Wynn Banker, MD  DISCHARGE SUMMARY  DATE OF DISCHARGE:  07/25/2018  DISCHARGE DIAGNOSES: 1.  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 2.  Deep venous thrombosis prophylaxis with Xarelto. 3.  Pain management. 4.  Acute blood loss anemia. 5.  Constipation.  HISTORY OF PRESENT ILLNESS:  A 62 year old right-handed male who lives with spouse, independent prior to admission.  Presented 07/12/2018 with bilateral knee pain.  No relief with conservative care, related to end-stage osteoarthritis.  Underwent  bilateral TKA 07/12/2018 per Dr. Lequita Halt.  HOSPITAL COURSE:  Pain management.  Weightbearing as tolerated.  Acute blood loss anemia 8.6.  Maintained on Xarelto for DVT prophylaxis.  Therapies completed.  The patient was admitted for a comprehensive rehab program.  PAST MEDICAL HISTORY:  See discharge diagnoses.  SOCIAL HISTORY:  Lives with spouse, independent prior to admission.  FUNCTIONAL STATUS:  Upon admission to rehab services, minimal assist 28 feet rolling walker, moderate assist stand-pivot transfers, total assist lower body ADLs.  PHYSICAL EXAMINATION: VITAL SIGNS:  Blood pressure 136/84, pulse 100, temperature 98, respirations 16. GENERAL:  Alert male in no acute distress.  Oriented x3. HEENT:  EOMs intact. NECK:  Supple, nontender, no JVD. CARDIOVASCULAR:  Rate controlled. ABDOMEN:  Soft, nontender, good bowel sounds. LUNGS:  Clear to auscultation without wheeze.  Bilateral knee incisions clean and dry.  REHABILITATION HOSPITAL COURSE:  The patient was admitted to inpatient rehab services.  Therapies initiated on a 3-hour daily basis, consisting of physical and occupational therapy and rehabilitation nursing.  The following issues were addressed during  patient's rehab stay.  Pertaining to Mr.  [name]'s bilateral TKA 07/12/2018, neurovascular sensation intact.  Weightbearing as tolerated.  He would follow up with orthopedic services.  He remained on oxycodone, Ultram and Robaxin as needed for pain as well  as Xarelto for DVT prophylaxis with venous Doppler studies negative.  Acute blood loss anemia, stable at 9.0.  No bleeding episodes.  Bouts of constipation resolved with laxative assistance.  The patient received weekly collaborative interdisciplinary  team conferences to discuss estimated length of stay, family teaching, any barriers to discharge.  He was ambulating up to 500 feet supervision rolling walker.  Sessions focused on knee extension.  The patient maintains knees flexed at 25 degrees during  ambulation.  He can ambulate to and from his room to the rehab therapy gym.  Gathered belongings for activities of daily living and homemaking.  Needing some assistance for lower body.  Full family teaching was completed.  It was addressed.  No driving.   Discharge to home.  DISCHARGE MEDICATIONS:  Included multivitamin daily, Protonix 40 mg p.o. daily, MiraLax daily, hold for loose stools; Xarelto 10 mg p.o. daily, Robaxin 500 mg every 6 hours as needed for muscle spasms, Ultram 50 mg every 6 hours as needed for pain.  The patient's oxycodone has been discontinued.  DIET:  Regular.  FOLLOWUP:  He would follow up with Dr. Claudette Laws at the outpatient rehab center only as needed; Dr. Ollen Gross, orthopedic services, call for appointment; Dr. Janece Canterbury, medical management.  SPECIAL INSTRUCTIONS:  No driving.  LN/NUANCE D:07/24/2018 T:07/24/2018 JOB:[id number]

## 2018-07-24 NOTE — Plan of Care (Signed)
  Problem: Consults Goal: RH GENERAL PATIENT EDUCATION Description See Patient Education module for education specifics. Outcome: Completed/Met Goal: Skin Care Protocol Initiated - if Braden Score 18 or less Description If consults are not indicated, leave blank or document N/A Outcome: Completed/Met   Problem: RH BOWEL ELIMINATION Goal: RH STG MANAGE BOWEL WITH ASSISTANCE Description STG Manage Bowel with  [place].  Outcome: Completed/Met   Problem: RH BLADDER ELIMINATION Goal: RH STG MANAGE BLADDER WITH ASSISTANCE Description STG Manage Bladder With  Mod I   Outcome: Completed/Met Goal: RH STG MANAGE BLADDER WITH MEDICATION WITH ASSISTANCE Description STG           N/A,                    Manage Bladder With Medication With Assistance.  Outcome: Completed/Met   Problem: RH SKIN INTEGRITY Goal: RH STG SKIN FREE OF INFECTION/BREAKDOWN Description No new skin breakdown or infection at discharge  Outcome: Completed/Met Goal: RH STG MAINTAIN SKIN INTEGRITY WITH ASSISTANCE Description STG Maintain Skin Integrity With  Mod I   Outcome: Completed/Met Goal: RH STG ABLE TO PERFORM INCISION/WOUND CARE W/ASSISTANCE Description STG Able To Perform Incision/Wound Care With min  Assistance.  Outcome: Completed/Met   Problem: RH SAFETY Goal: RH STG ADHERE TO SAFETY PRECAUTIONS W/ASSISTANCE/DEVICE Description STG Adhere to Safety Precautions With  Min Assistance/Device.  Outcome: Completed/Met   Problem: RH PAIN MANAGEMENT Goal: RH STG PAIN MANAGED AT OR BELOW PT'S PAIN GOAL Description Less than 3 on 0-10 scale   Outcome: Completed/Met   Problem: RH KNOWLEDGE DEFICIT GENERAL Goal: RH STG INCREASE KNOWLEDGE OF SELF CARE AFTER HOSPITALIZATION Description Patient and wife will verbalize increased knowledge of self care prior to discharge.  Outcome: Completed/Met

## 2018-07-25 ENCOUNTER — Inpatient Hospital Stay (HOSPITAL_COMMUNITY): Payer: 59 | Admitting: Occupational Therapy

## 2018-07-25 ENCOUNTER — Inpatient Hospital Stay (HOSPITAL_COMMUNITY): Payer: 59 | Admitting: Physical Therapy

## 2018-07-25 ENCOUNTER — Inpatient Hospital Stay (HOSPITAL_COMMUNITY): Payer: 59

## 2018-07-25 MED ORDER — PANTOPRAZOLE SODIUM 40 MG PO TBEC: 40.0000 mg | DELAYED_RELEASE_TABLET | Freq: Every day | ORAL | 0 refills | Status: AC

## 2018-07-25 MED ORDER — RIVAROXABAN 10 MG PO TABS: 10.0000 mg | ORAL_TABLET | Freq: Every day | ORAL | 0 refills | Status: AC

## 2018-07-25 MED ORDER — METHOCARBAMOL 500 MG PO TABS: 500.0000 mg | ORAL_TABLET | Freq: Four times a day (QID) | ORAL | 0 refills | Status: AC | PRN

## 2018-07-25 MED ORDER — POLYETHYLENE GLYCOL 3350 17 G PO PACK: 17.0000 g | PACK | Freq: Every day | ORAL | 0 refills | Status: AC

## 2018-07-25 MED ORDER — TRAMADOL HCL 50 MG PO TABS: 50.0000 mg | ORAL_TABLET | Freq: Four times a day (QID) | ORAL | 0 refills | Status: AC | PRN

## 2018-07-25 MED ORDER — TRAMADOL HCL 50 MG PO TABS
50.0000 mg | ORAL_TABLET | Freq: Once | ORAL | Status: AC
  Administered 2018-07-25: 50 mg via ORAL

## 2018-07-25 MED ORDER — TRAMADOL HCL 50 MG PO TABS: 50.0000 mg | ORAL_TABLET | Freq: Four times a day (QID) | ORAL | 0 refills | Status: DC | PRN

## 2018-07-25 MED ORDER — TRAMADOL HCL 50 MG PO TABS
50.0000 mg | ORAL_TABLET | Freq: Four times a day (QID) | ORAL | Status: DC | PRN
  Administered 2018-07-25: 50 mg via ORAL
  Filled 2018-07-25 (×2): qty 1

## 2018-07-25 MED ORDER — TAB-A-VITE/IRON PO TABS: 1.0000 | ORAL_TABLET | Freq: Every day | ORAL | 0 refills | Status: AC

## 2018-07-25 NOTE — Progress Notes (Signed)
Patient is scheduled for Discharge today. Upon shift assessment Patient is alert and oriented x 4. Patient medicated with Ultram 50 mg po for Bilateral Knee Pain prior to Discharge as ordered. No s/s acute distress noted. Patient discharged to Home with personal belongings and assistive devices. Patient transported by Spouse via private vehicle.

## 2018-07-25 NOTE — Progress Notes (Signed)
[facility] PHYSICAL MEDICINE & REHABILITATION PROGRESS NOTE   Subjective/Complaints:  No new issues , mod I in room, had BM  ROS- neg CP, SOB, N/V/D   Objective:   No results found. Recent Labs    07/24/18 0701  WBC 9.8  HGB 9.3*  HCT 29.8*  PLT 464*   No results for input(s): NA, K, CL, CO2, GLUCOSE, BUN, CREATININE, CALCIUM in the last 72 hours.  Intake/Output Summary (Last 24 hours) at 07/25/2018 0739 Last data filed at 07/24/2018 1900 Gross per 24 hour  Intake 960 ml  Output -  Net 960 ml     Physical Exam: Vital Signs Blood pressure 132/90, pulse (!) 102, temperature 98 F (36.7 C), temperature source Oral, resp. rate 18, height 6\' 5"  (1.956 m), weight 125.2 kg, SpO2 98 %.   General: No acute distress Mood and affect are appropriate Heart: Regular rate and rhythm no rubs murmurs or extra sounds Lungs: Clear to auscultation, breathing unlabored, no rales or wheezes Abdomen: Positive bowel sounds, soft nontender to palpation, nondistended Extremities: No clubbing, cyanosis, or edema Skin: No evidence of breakdown, no evidence of rash, knee incision with gauze dressing Neurologic: Cranial nerves II through XII intact, motor strength is 5/5 in bilateral deltoid, bicep, tricep, grip, 4- hip flexor, knee extensors,5 ankle dorsiflexor and plantar flexor Sensory exam normal sensation to light touch and proprioception in bilateral upper and lower extremities  Musculoskeletal: decreased knee flexion and ext , able to do SLR bilaterallu   Assessment/Plan: 1. Functional deficits secondary to Bilateral TKR. Stable for D/C today F/u PCP in 3-4 weeks F/u PM&R 2 weeks See D/C summary See D/C instructions Care Tool:  Bathing    Body parts bathed by patient: Right arm, Left arm, Chest, Abdomen, Front perineal area, Buttocks, Right upper leg, Left upper leg, Face   Body parts bathed by helper: Right lower leg, Left lower leg     Bathing assist Assist Level:  Contact Guard/Touching assist     Upper Body Dressing/Undressing Upper body dressing   What is the patient wearing?: Pull over shirt    Upper body assist Assist Level: Set up assist    Lower Body Dressing/Undressing Lower body dressing      What is the patient wearing?: Underwear/pull up, Pants     Lower body assist Assist for lower body dressing: Contact Guard/Touching assist(in standing)     Toileting Toileting Toileting Activity did not occur (Clothing management and hygiene only): (bedpan used)  Toileting assist Assist for toileting: Moderate Assistance - Patient 50 - 74%     Transfers Chair/bed transfer  Transfers assist  Chair/bed transfer activity did not occur: Safety/medical concerns  Chair/bed transfer assist level: Independent with assistive device Chair/bed transfer assistive device: Geologist, engineering   Ambulation assist   Ambulation activity did not occur: Safety/medical concerns  Assist level: Independent with assistive device Assistive device: Walker-rolling Max distance: >150 ft    Walk 10 feet activity   Assist  Walk 10 feet activity did not occur: Safety/medical concerns  Assist level: Independent with assistive device Assistive device: Walker-rolling   Walk 50 feet activity   Assist Walk 50 feet with 2 turns activity did not occur: Safety/medical concerns  Assist level: Independent with assistive device Assistive device: Walker-rolling    Walk 150 feet activity   Assist Walk 150 feet activity did not occur: Safety/medical concerns  Assist level: Independent with assistive device Assistive device: Walker-rolling    Walk 10 feet on  uneven surface  activity   Assist Walk 10 feet on uneven surfaces activity did not occur: Safety/medical concerns   Assist level: Supervision/Verbal cueing Assistive device: PhotographerWalker-rolling   Wheelchair     Assist Will patient use wheelchair at discharge?: No Type of  Wheelchair: Manual Wheelchair activity did not occur: N/A  Wheelchair assist level: Supervision/Verbal cueing Max wheelchair distance: 150    Wheelchair 50 feet with 2 turns activity    Assist    Wheelchair 50 feet with 2 turns activity did not occur: N/A   Assist Level: Supervision/Verbal cueing   Wheelchair 150 feet activity     Assist Wheelchair 150 feet activity did not occur: N/A   Assist Level: Supervision/Verbal cueing    Medical Problem List and Plan: 1.  Decreased functional mobility secondary to bilateral TKA 07/12/2018.  Weightbearing as tolerated PT, OT -stable for d/c2.  DVT Prophylaxis/Anticoagulation: Xarelto.  Negative vascular study 3. Pain Management: Oxycodone/Ultram and Robaxin as needed 4. Mood: Provide emotional support 5. Neuropsych: This patient is capable of making decisions on his own behalf. 6. Skin/Wound Care: Routine skin checks, Skin tear Right lateral thigh 7. Fluids/Electrolytes/Nutrition: Routine in and outs with follow-up chemistries Low alb will supplement 8.  Acute blood loss anemia. Slowly improving  9.  Constipation.  Laxative assistance, GLycolax, sorbitol and dulcolax ordered- has used all of these and has had 3 BMs  9.  Microscopic GI blood loss asymptomatic, multiple laxative with supp may have caused micro tear along with Xarelto, gastritis also a possibility, no further drop in hemoglobin, 10.  Constipation resolving cont miralax daily LOS: 8 days A FACE TO FACE EVALUATION WAS PERFORMED  Erick Colacendrew E Nadalee Neiswender 07/25/2018, 7:39 AM

## 2018-07-25 NOTE — Progress Notes (Signed)
Verbal Orders recieved to administer Ultram 50 mg po x1 dose prior to Discharge, per D.A., PA. Verbal Orders RBAV.

## 2019-05-26 IMAGING — DX DG CHEST 2V
2 series · 2 of 2 positions shown · non-contrast
Comparison: None.

CLINICAL DATA: Syncope today.

EXAM:
CHEST - 2 VIEW

[chest pa]
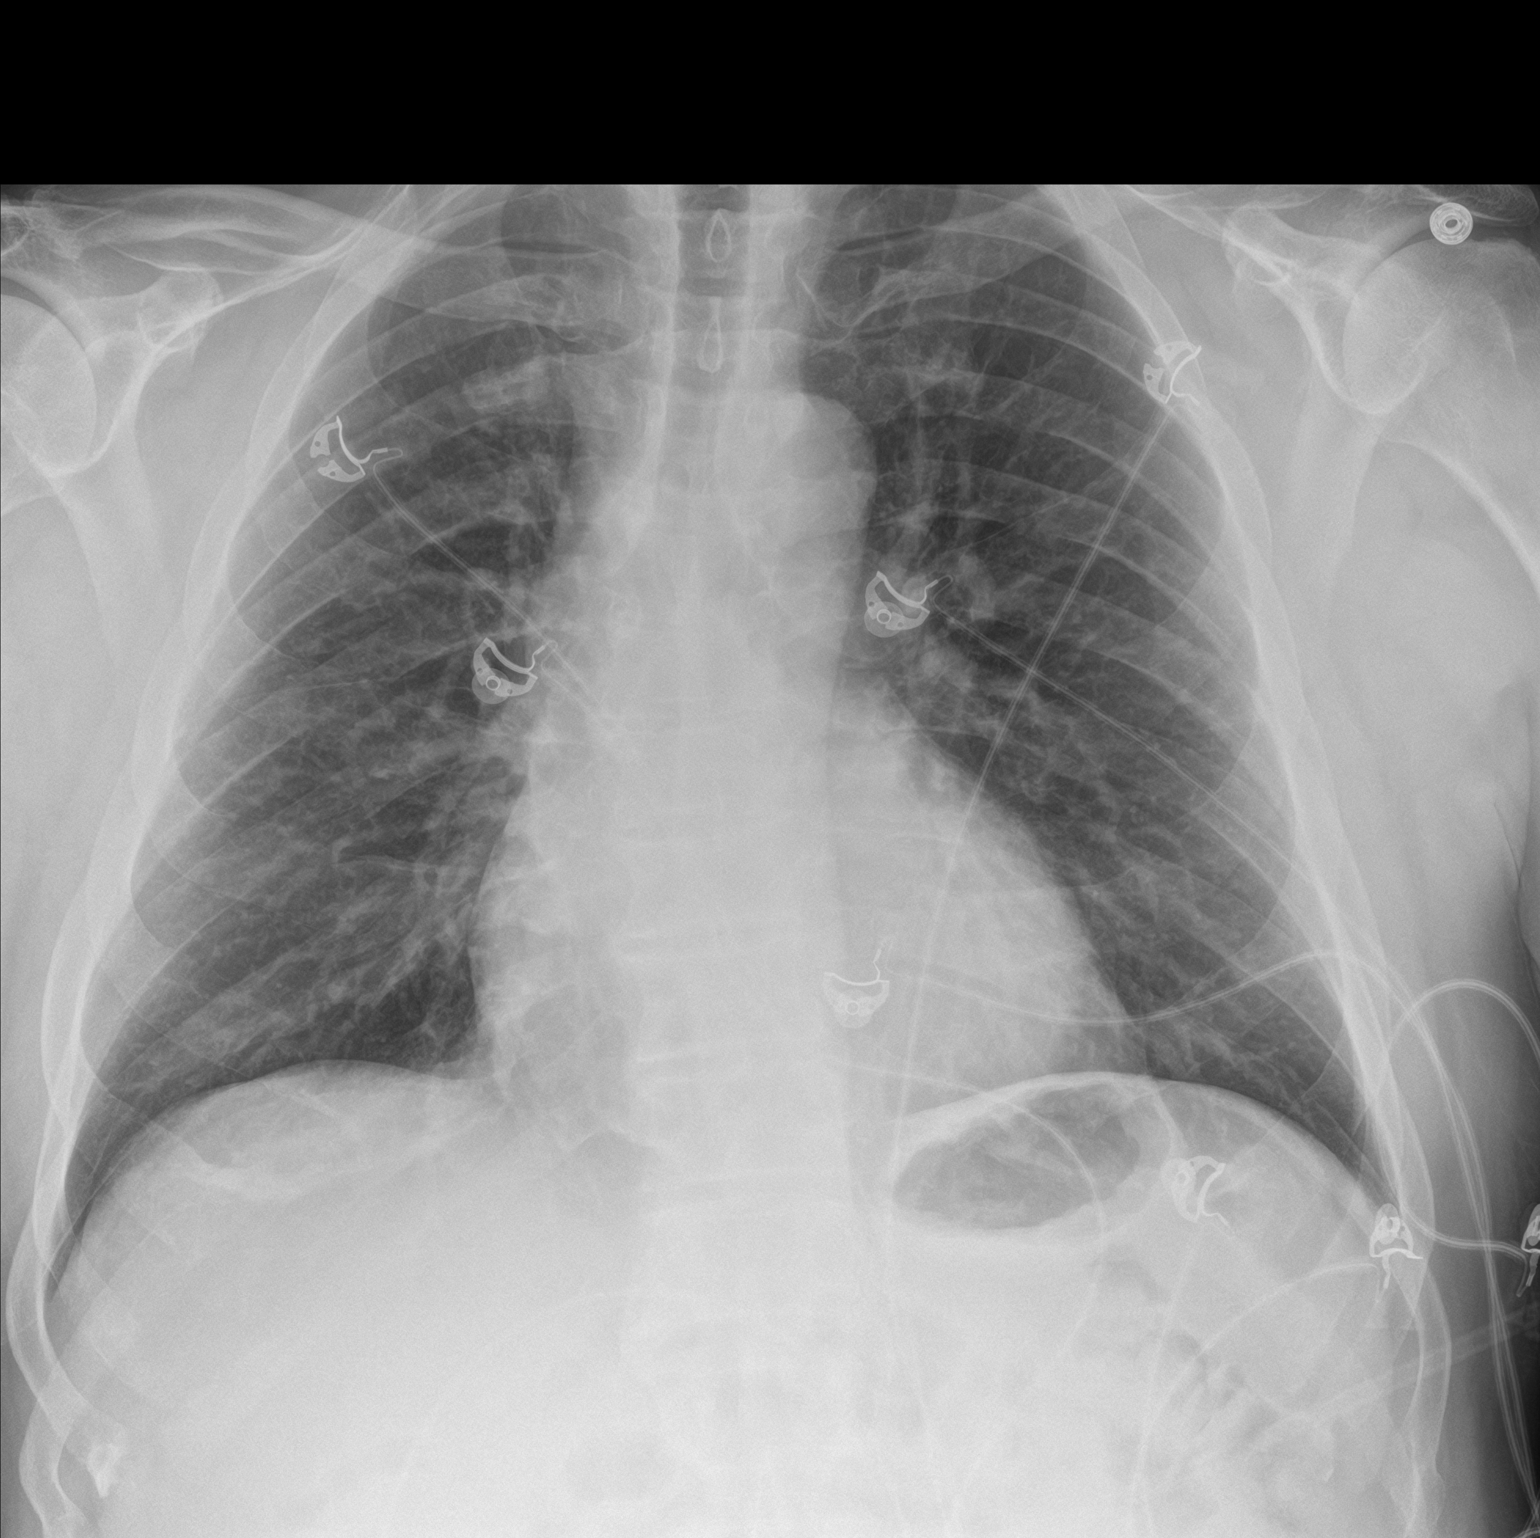

[chest lat]
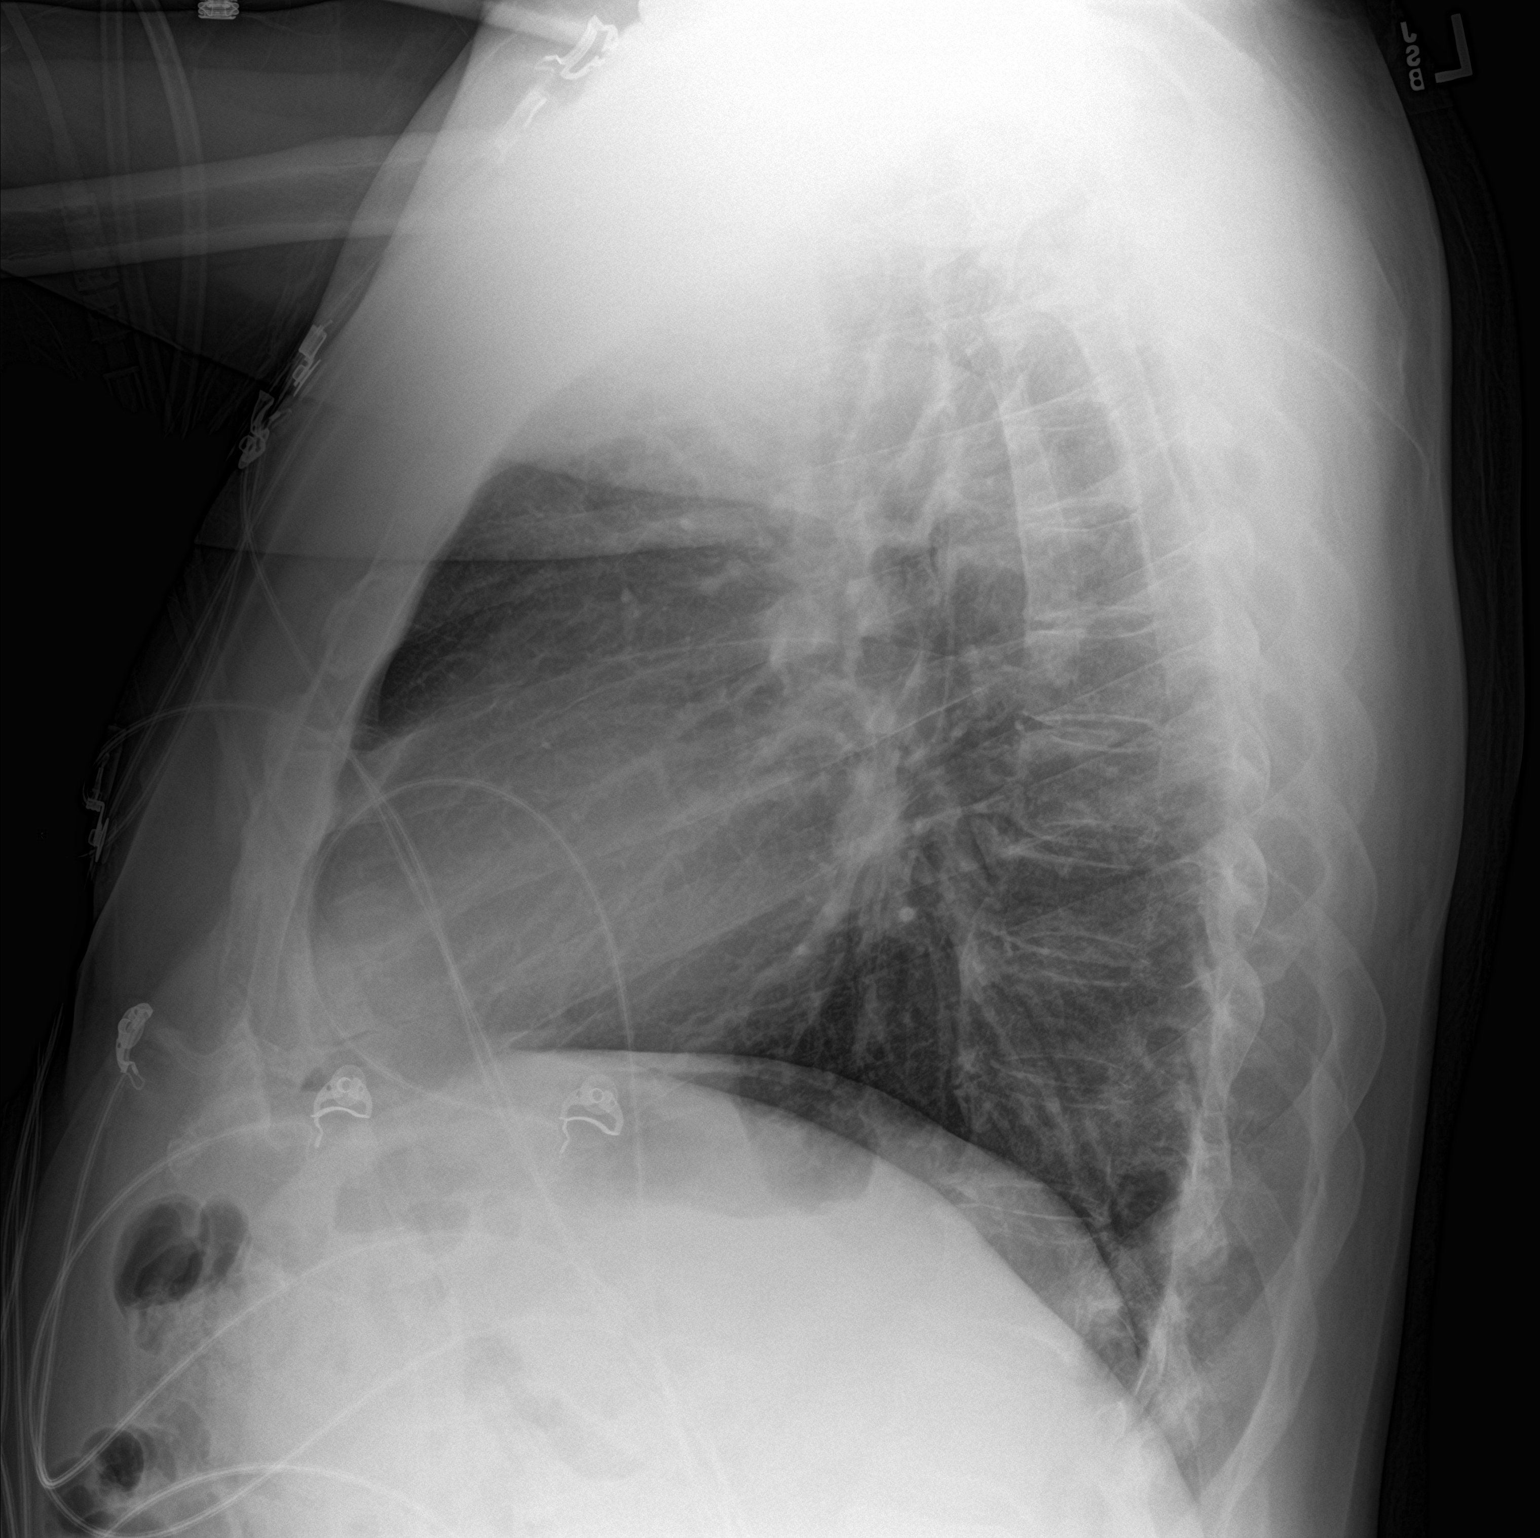

[2 of 2 positions shown; findings below may reference images not displayed]

FINDINGS: The lungs are clear. Heart size is normal. No pneumothorax or
pleural fluid. No acute or focal bony abnormality.
IMPRESSION: No acute disease.

## 2021-06-20 ENCOUNTER — Telehealth: Payer: 59 | Admitting: Nurse Practitioner

## 2021-06-20 DIAGNOSIS — L237 Allergic contact dermatitis due to plants, except food: Secondary | ICD-10-CM

## 2021-06-20 MED ORDER — PREDNISONE 10 MG PO TABS: ORAL_TABLET | ORAL | 0 refills | Status: AC

## 2021-06-20 NOTE — Progress Notes (Signed)
I have spent 5 minutes in review of e-visit questionnaire, review and updating patient chart, medical decision making and response to patient.  ° °Oral Remache W Mazella Deen, NP ° °  °

## 2021-06-20 NOTE — Progress Notes (Signed)
